# Patient Record
Sex: Female | Born: 1995 | Race: Black or African American | Hispanic: No | Smoking: Former smoker
Health system: Southern US, Community
[De-identification: ages and names within clinical notes are randomized; demographics above are authoritative.]

## PROBLEM LIST (undated history)

## (undated) ENCOUNTER — Inpatient Hospital Stay (HOSPITAL_COMMUNITY): Payer: Self-pay

## (undated) DIAGNOSIS — O24419 Gestational diabetes mellitus in pregnancy, unspecified control: Secondary | ICD-10-CM

## (undated) DIAGNOSIS — R002 Palpitations: Secondary | ICD-10-CM

## (undated) DIAGNOSIS — R079 Chest pain, unspecified: Secondary | ICD-10-CM

## (undated) HISTORY — DX: Palpitations: R00.2

## (undated) HISTORY — PX: TONSILLECTOMY: SUR1361

## (undated) HISTORY — PX: ADENOIDECTOMY: SUR15

## (undated) HISTORY — DX: Chest pain, unspecified: R07.9

---

## 2014-06-26 ENCOUNTER — Emergency Department (HOSPITAL_COMMUNITY): Payer: Medicaid Other

## 2014-06-26 ENCOUNTER — Emergency Department (HOSPITAL_COMMUNITY)
Admission: EM | Admit: 2014-06-26 | Discharge: 2014-06-26 | Disposition: A | Discharge status: Home / Self Care | Payer: Medicaid Other | Attending: Emergency Medicine | Admitting: Emergency Medicine

## 2014-06-26 ENCOUNTER — Encounter (HOSPITAL_COMMUNITY): Payer: Self-pay | Admitting: Emergency Medicine

## 2014-06-26 DIAGNOSIS — R1011 Right upper quadrant pain: Secondary | ICD-10-CM | POA: Insufficient documentation

## 2014-06-26 DIAGNOSIS — Z3202 Encounter for pregnancy test, result negative: Secondary | ICD-10-CM | POA: Diagnosis not present

## 2014-06-26 LAB — COMPREHENSIVE METABOLIC PANEL
ALBUMIN: 3.9 g/dL (ref 3.5–5.2)
ALK PHOS: 55 U/L (ref 39–117)
ALT: 14 U/L (ref 0–35)
ANION GAP: 11 (ref 5–15)
AST: 18 U/L (ref 0–37)
BUN: 12 mg/dL (ref 6–23)
CALCIUM: 9.1 mg/dL (ref 8.4–10.5)
CO2: 24 mEq/L (ref 19–32)
Chloride: 105 mEq/L (ref 96–112)
Creatinine, Ser: 0.71 mg/dL (ref 0.50–1.10)
GFR calc non Af Amer: >90 mL/min (ref >90)
GLUCOSE: 89 mg/dL (ref 70–99)
POTASSIUM: 4 meq/L (ref 3.7–5.3)
Sodium: 140 mEq/L (ref 137–147)
TOTAL PROTEIN: 6.9 g/dL (ref 6.0–8.3)
Total Bilirubin: <0.2 mg/dL — ABNORMAL LOW (ref 0.3–1.2)

## 2014-06-26 LAB — URINALYSIS, ROUTINE W REFLEX MICROSCOPIC
Bilirubin Urine: NEGATIVE
GLUCOSE, UA: NEGATIVE mg/dL
Hgb urine dipstick: NEGATIVE
Ketones, ur: NEGATIVE mg/dL
LEUKOCYTES UA: NEGATIVE
Nitrite: NEGATIVE
PH: 6.5 (ref 5.0–8.0)
Protein, ur: NEGATIVE mg/dL
SPECIFIC GRAVITY, URINE: 1.034 — AB (ref 1.005–1.030)
Urobilinogen, UA: 1 mg/dL (ref 0.0–1.0)

## 2014-06-26 LAB — CBC WITH DIFFERENTIAL/PLATELET
Basophils Absolute: 0 10*3/uL (ref 0.0–0.1)
Basophils Relative: 1 % (ref 0–1)
Eosinophils Absolute: 0.1 10*3/uL (ref 0.0–0.7)
Eosinophils Relative: 2 % (ref 0–5)
HEMATOCRIT: 32.3 % — AB (ref 36.0–46.0)
HEMOGLOBIN: 10.7 g/dL — AB (ref 12.0–15.0)
LYMPHS ABS: 3 10*3/uL (ref 0.7–4.0)
Lymphocytes Relative: 48 % — ABNORMAL HIGH (ref 12–46)
MCH: 29.9 pg (ref 26.0–34.0)
MCHC: 33.1 g/dL (ref 30.0–36.0)
MCV: 90.2 fL (ref 78.0–100.0)
MONO ABS: 0.5 10*3/uL (ref 0.1–1.0)
MONOS PCT: 8 % (ref 3–12)
NEUTROS ABS: 2.6 10*3/uL (ref 1.7–7.7)
Neutrophils Relative %: 41 % — ABNORMAL LOW (ref 43–77)
Platelets: 319 10*3/uL (ref 150–400)
RBC: 3.58 MIL/uL — AB (ref 3.87–5.11)
RDW: 12.9 % (ref 11.5–15.5)
WBC: 6.2 10*3/uL (ref 4.0–10.5)

## 2014-06-26 LAB — LIPASE, BLOOD: Lipase: 21 U/L (ref 11–59)

## 2014-06-26 LAB — POC URINE PREG, ED: PREG TEST UR: NEGATIVE

## 2014-06-26 MED ORDER — HYDROCODONE-ACETAMINOPHEN 5-325 MG PO TABS: 1.0000 | ORAL_TABLET | Freq: Four times a day (QID) | ORAL | Status: DC | PRN

## 2014-06-26 MED ORDER — PROMETHAZINE HCL 25 MG PO TABS: 25.0000 mg | ORAL_TABLET | Freq: Four times a day (QID) | ORAL | Status: DC | PRN

## 2014-06-26 MED ORDER — IBUPROFEN 600 MG PO TABS: 600.0000 mg | ORAL_TABLET | Freq: Four times a day (QID) | ORAL | Status: DC | PRN

## 2014-06-26 NOTE — ED Notes (Signed)
Patient here with complaint of lower abdominal and back pain. Endorses tenderness with palpation which radiates into pelvis. Denies fevers. Pain is intermittent.

## 2014-06-26 NOTE — ED Notes (Signed)
MD at bedside. 

## 2014-06-26 NOTE — ED Provider Notes (Signed)
CSN: [id number]     Arrival date & time 06/26/14  0136 History   This chart was scribed for Derwood KaplanAnkit Cassandra Harbold, MD by Freida Busmaniana Omoyeni, ED Scribe. This patient was seen in room D35C/D35C and the patient's care was started 3:39 AM.    Chief Complaint  Patient presents with  . Abdominal Pain  . Back Pain     The history is provided by the patient. No language interpreter was used.    HPI Comments:  Robin [name] is a 18 y.o. female who presents to the Emergency Department complaining of intermittent sharp abdominal pain that radiates into her back pain, with initial onset 2 months ago. She notes today's episode started around 0200 and has been constant since; she notes pain today is worse than pain felt in the past. She also reports the majority of her pain to her RUQ and lower abdomen; states RUQ pain is exacerbated by palpation. She reports associated nausea; none at this time. She denies a h/o pelvic disorders and heavy ETOH use. She also denies fever, dysuria, hematuria, vomiting, and diarrhea. No alleviating factors noted.   History reviewed. No pertinent past medical history. Past Surgical History  Procedure Laterality Date  . Tonsillectomy    . Adenoidectomy     History reviewed. No pertinent family history. History  Substance Use Topics  . Smoking status: Never Smoker   . Smokeless tobacco: Not on file  . Alcohol Use: No   OB History    No data available     Review of Systems  Constitutional: Negative for fever.  Gastrointestinal: Positive for nausea and abdominal distention. Negative for vomiting and diarrhea.  Genitourinary: Negative for dysuria and hematuria.  Musculoskeletal: Positive for back pain.  All other systems reviewed and are negative.     Allergies  Review of patient's allergies indicates no known allergies.  Home Medications   Prior to Admission medications   Medication Sig Start Date End Date Taking? Authorizing Provider  HYDROcodone-acetaminophen  (NORCO/VICODIN) 5-325 MG per tablet Take 1 tablet by mouth every 6 (six) hours as needed. 06/26/14   Derwood KaplanAnkit Ciara Kagan, MD  ibuprofen (ADVIL,MOTRIN) 600 MG tablet Take 1 tablet (600 mg total) by mouth every 6 (six) hours as needed. 06/26/14   Derwood KaplanAnkit Edilberto Roosevelt, MD  promethazine (PHENERGAN) 25 MG tablet Take 1 tablet (25 mg total) by mouth every 6 (six) hours as needed for nausea. 06/26/14   Tangie Stay, MD   BP 112/50 mmHg  Pulse 75  Temp(Src) 98.4 F (36.9 C) (Oral)  Resp 16  Ht 5\' 6"  (1.676 m)  Wt 290 lb (131.543 kg)  BMI 46.83 kg/m2  SpO2 100%  LMP  Physical Exam  Constitutional: She is oriented to person, place, and time. She appears well-developed and well-nourished.  HENT:  Head: Normocephalic and atraumatic.  Cardiovascular: Normal rate.   Pulmonary/Chest: Effort normal.  Abdominal: Soft. Bowel sounds are normal. She exhibits no distension. There is tenderness.  Diffuse TTP worse over RUQ and suprapubic abdomen.  Musculoskeletal: She exhibits no edema or tenderness.  Neurological: She is alert and oriented to person, place, and time.  Skin: Skin is warm and dry.  Psychiatric: She has a normal mood and affect.  Nursing note and vitals reviewed.   ED Course  Procedures   DIAGNOSTIC STUDIES:  Oxygen Saturation is 99% on RA, normal by my interpretation.    COORDINATION OF CARE:  3:44 AM Discussed treatment plan with pt at bedside and pt agreed to plan.  Labs Review  Labs Reviewed  COMPREHENSIVE METABOLIC PANEL - Abnormal; Notable for the following:    Total Bilirubin <0.2 (*)    All other components within normal limits  CBC WITH DIFFERENTIAL - Abnormal; Notable for the following:    RBC 3.58 (*)    Hemoglobin 10.7 (*)    HCT 32.3 (*)    Neutrophils Relative % 41 (*)    Lymphocytes Relative 48 (*)    All other components within normal limits  URINALYSIS, ROUTINE W REFLEX MICROSCOPIC - Abnormal; Notable for the following:    APPearance HAZY (*)    Specific Gravity,  Urine 1.034 (*)    All other components within normal limits  LIPASE, BLOOD  POC URINE PREG, ED  POC URINE PREG, ED    Imaging Review No results found.   EKG Interpretation None     Final result by Rad Results In Interface (06/26/14 07:24:06)   Narrative:   CLINICAL DATA: Right upper quadrant pain for 2 months.  EXAM: US ABDOMEN LIMITED - RIGHT UPPER QUADRANT  COMPARISON: None.  FINDINGS: Gallbladder:  No gallstones or wall thickening visualized. No sonographic Murphy sign noted.  Common bile duct:  Diameter: Normal caliber, 4 mm  Liver:  No focal lesion identified. Within normal limits in parenchymal echogenicity.  IMPRESSION: Unremarkable right upper quadrant ultrasound.    MDM   Final diagnoses:  RUQ abdominal pain    DDx includes: Pancreatitis Hepatobiliary pathology including cholecystitis Gastritis/PUD SBO  Pt comes in with cc of abd pain. Pain is RUQ. US and labs are unremarkable. Will give her GI info - as she mild have biliary dyskinesia or other functional abnormality of the hepatobiliary system.      Derwood KaplanAnkit Latysha Thackston, MD 06/28/14 [phone]

## 2014-06-26 NOTE — Discharge Instructions (Signed)
We saw you in the ER for the abdominal pain. All of our results are normal, including all labs and imaging. Kidney function is fine as well. We are not sure what is causing your abdominal pain, but it still could be due to gallbladder problems.  We recommend that you see your primary care doctor within 2-3 days for further evaluation, and contact GI doctor as well. If your symptoms get worse, return to the ER. Take the pain meds and nausea meds as prescribed.   Abdominal Pain, Women Abdominal (stomach, pelvic, or belly) pain can be caused by many things. It is important to tell your doctor:  The location of the pain.  Does it come and go or is it present all the time?  Are there things that start the pain (eating certain foods, exercise)?  Are there other symptoms associated with the pain (fever, nausea, vomiting, diarrhea)? All of this is helpful to know when trying to find the cause of the pain. CAUSES   Stomach: virus or bacteria infection, or ulcer.  Intestine: appendicitis (inflamed appendix), regional ileitis (Crohn's disease), ulcerative colitis (inflamed colon), irritable bowel syndrome, diverticulitis (inflamed diverticulum of the colon), or cancer of the stomach or intestine.  Gallbladder disease or stones in the gallbladder.  Kidney disease, kidney stones, or infection.  Pancreas infection or cancer.  Fibromyalgia (pain disorder).  Diseases of the female organs:  Uterus: fibroid (non-cancerous) tumors or infection.  Fallopian tubes: infection or tubal pregnancy.  Ovary: cysts or tumors.  Pelvic adhesions (scar tissue).  Endometriosis (uterus lining tissue growing in the pelvis and on the pelvic organs).  Pelvic congestion syndrome (female organs filling up with blood just before the menstrual period).  Pain with the menstrual period.  Pain with ovulation (producing an egg).  Pain with an IUD (intrauterine device, birth control) in the uterus.  Cancer of  the female organs.  Functional pain (pain not caused by a disease, may improve without treatment).  Psychological pain.  Depression. DIAGNOSIS  Your doctor will decide the seriousness of your pain by doing an examination.  Blood tests.  X-rays.  Ultrasound.  CT scan (computed tomography, special type of X-ray).  MRI (magnetic resonance imaging).  Cultures, for infection.  Barium enema (dye inserted in the large intestine, to better view it with X-rays).  Colonoscopy (looking in intestine with a lighted tube).  Laparoscopy (minor surgery, looking in abdomen with a lighted tube).  Major abdominal exploratory surgery (looking in abdomen with a large incision). TREATMENT  The treatment will depend on the cause of the pain.   Many cases can be observed and treated at home.  Over-the-counter medicines recommended by your caregiver.  Prescription medicine.  Antibiotics, for infection.  Birth control pills, for painful periods or for ovulation pain.  Hormone treatment, for endometriosis.  Nerve blocking injections.  Physical therapy.  Antidepressants.  Counseling with a psychologist or psychiatrist.  Minor or major surgery. HOME CARE INSTRUCTIONS   Do not take laxatives, unless directed by your caregiver.  Take over-the-counter pain medicine only if ordered by your caregiver. Do not take aspirin because it can cause an upset stomach or bleeding.  Try a clear liquid diet (broth or water) as ordered by your caregiver. Slowly move to a bland diet, as tolerated, if the pain is related to the stomach or intestine.  Have a thermometer and take your temperature several times a day, and record it.  Bed rest and sleep, if it helps the pain.  Avoid  sexual intercourse, if it causes pain.  Avoid stressful situations.  Keep your follow-up appointments and tests, as your caregiver orders.  If the pain does not go away with medicine or surgery, you may  try:  Acupuncture.  Relaxation exercises (yoga, meditation).  Group therapy.  Counseling. SEEK MEDICAL CARE IF:   You notice certain foods cause stomach pain.  Your home care treatment is not helping your pain.  You need stronger pain medicine.  You want your IUD removed.  You feel faint or lightheaded.  You develop nausea and vomiting.  You develop a rash.  You are having side effects or an allergy to your medicine. SEEK IMMEDIATE MEDICAL CARE IF:   Your pain does not go away or gets worse.  You have a fever.  Your pain is felt only in portions of the abdomen. The right side could possibly be appendicitis. The left lower portion of the abdomen could be colitis or diverticulitis.  You are passing blood in your stools (bright red or black tarry stools, with or without vomiting).  You have blood in your urine.  You develop chills, with or without a fever.  You pass out. MAKE SURE YOU:   Understand these instructions.  Will watch your condition.  Will get help right away if you are not doing well or get worse. Document Released: 04/23/2007 Document Revised: 11/10/2013 Document Reviewed: 05/13/2009 [facility] Patient Information 2015 [place] [place] [place]. This information is not intended to replace advice given to you by your health care provider. Make sure you discuss any questions you have with your health care provider.  Biliary Colic  Biliary colic is a steady or irregular pain in the upper abdomen. It is usually under the right side of the rib cage. It happens when gallstones interfere with the normal flow of bile from the gallbladder. Bile is a liquid that helps to digest fats. Bile is made in the liver and stored in the gallbladder. When you eat a meal, bile passes from the gallbladder through the cystic duct and the common bile duct into the small intestine. There, it mixes with partially digested food. If a gallstone blocks either of these ducts, the normal flow of  bile is blocked. The muscle cells in the bile duct contract forcefully to try to move the stone. This causes the pain of biliary colic.  SYMPTOMS   A person with biliary colic usually complains of pain in the upper abdomen. This pain can be:  In the center of the upper abdomen just below the breastbone.  In the upper-right part of the abdomen, near the gallbladder and liver.  Spread back toward the right shoulder blade.  Nausea and vomiting.  The pain usually occurs after eating.  Biliary colic is usually triggered by the digestive system's demand for bile. The demand for bile is high after fatty meals. Symptoms can also occur when a person who has been fasting suddenly eats a very large meal. Most episodes of biliary colic pass after 1 to 5 hours. After the most intense pain passes, your abdomen may continue to ache mildly for about 24 hours. DIAGNOSIS  After you describe your symptoms, your caregiver will perform a physical exam. He or she will pay attention to the upper right portion of your belly (abdomen). This is the area of your liver and gallbladder. An ultrasound will help your caregiver look for gallstones. Specialized scans of the gallbladder may also be done. Blood tests may be done, especially if you have  fever or if your pain persists. PREVENTION  Biliary colic can be prevented by controlling the risk factors for gallstones. Some of these risk factors, such as heredity, increasing age, and pregnancy are a normal part of life. Obesity and a high-fat diet are risk factors you can change through a healthy lifestyle. Women going through menopause who take hormone replacement therapy (estrogen) are also more likely to develop biliary colic. TREATMENT   Pain medication may be prescribed.  You may be encouraged to eat a fat-free diet.  If the first episode of biliary colic is severe, or episodes of colic keep retuning, surgery to remove the gallbladder (cholecystectomy) is usually  recommended. This procedure can be done through small incisions using an instrument called a laparoscope. The procedure often requires a brief stay in the hospital. Some people can leave the hospital the same day. It is the most widely used treatment in people troubled by painful gallstones. It is effective and safe, with no complications in more than 90% of cases.  If surgery cannot be done, medication that dissolves gallstones may be used. This medication is expensive and can take months or years to work. Only small stones will dissolve.  Rarely, medication to dissolve gallstones is combined with a procedure called shock-wave lithotripsy. This procedure uses carefully aimed shock waves to break up gallstones. In many people treated with this procedure, gallstones form again within a few years. PROGNOSIS  If gallstones block your cystic duct or common bile duct, you are at risk for repeated episodes of biliary colic. There is also a 25% chance that you will develop a gallbladder infection(acute cholecystitis), or some other complication of gallstones within 10 to 20 years. If you have surgery, schedule it at a time that is convenient for you and at a time when you are not sick. HOME CARE INSTRUCTIONS   Drink plenty of clear fluids.  Avoid fatty, greasy or fried foods, or any foods that make your pain worse.  Take medications as directed. SEEK MEDICAL CARE IF:   You develop a fever over 100.5 F (38.1 C).  Your pain gets worse over time.  You develop nausea that prevents you from eating and drinking.  You develop vomiting. SEEK IMMEDIATE MEDICAL CARE IF:   You have continuous or severe belly (abdominal) pain which is not relieved with medications.  You develop nausea and vomiting which is not relieved with medications.  You have symptoms of biliary colic and you suddenly develop a fever and shaking chills. This may signal cholecystitis. Call your caregiver immediately.  You develop a  yellow color to your skin or the white part of your eyes (jaundice). Document Released: 11/27/2005 Document Revised: 09/18/2011 Document Reviewed: 02/06/2008 [facility] Patient Information 2015 [place], [place]. This information is not intended to replace advice given to you by your health care provider. Make sure you discuss any questions you have with your health care provider.

## 2017-05-22 ENCOUNTER — Other Ambulatory Visit (HOSPITAL_COMMUNITY): Payer: Self-pay | Admitting: Obstetrics and Gynecology

## 2017-05-22 DIAGNOSIS — Z3A20 20 weeks gestation of pregnancy: Secondary | ICD-10-CM

## 2017-05-22 DIAGNOSIS — Z3689 Encounter for other specified antenatal screening: Secondary | ICD-10-CM

## 2017-05-22 DIAGNOSIS — O280 Abnormal hematological finding on antenatal screening of mother: Secondary | ICD-10-CM

## 2017-05-28 ENCOUNTER — Encounter (HOSPITAL_COMMUNITY): Payer: Self-pay

## 2017-05-30 ENCOUNTER — Inpatient Hospital Stay (HOSPITAL_COMMUNITY)
Admission: RE | Admit: 2017-05-30 | Discharge: 2017-05-30 | Disposition: A | Discharge status: Home / Self Care | Payer: Self-pay | Source: Ambulatory Visit

## 2017-05-30 ENCOUNTER — Ambulatory Visit (HOSPITAL_COMMUNITY)
Admission: RE | Admit: 2017-05-30 | Discharge: 2017-05-30 | Disposition: A | Discharge status: Home / Self Care | Payer: Medicaid Other | Source: Ambulatory Visit

## 2017-06-13 ENCOUNTER — Ambulatory Visit (HOSPITAL_COMMUNITY): Admission: RE | Admit: 2017-06-13 | Payer: Medicaid Other | Source: Ambulatory Visit

## 2017-06-13 ENCOUNTER — Ambulatory Visit (HOSPITAL_COMMUNITY): Payer: Medicaid Other

## 2017-06-15 ENCOUNTER — Ambulatory Visit (HOSPITAL_COMMUNITY): Admission: RE | Admit: 2017-06-15 | Payer: Medicaid Other | Source: Ambulatory Visit

## 2017-06-15 ENCOUNTER — Other Ambulatory Visit (HOSPITAL_COMMUNITY): Payer: Self-pay | Admitting: Obstetrics and Gynecology

## 2017-06-15 ENCOUNTER — Encounter (HOSPITAL_COMMUNITY): Payer: Self-pay

## 2017-06-15 ENCOUNTER — Ambulatory Visit (HOSPITAL_COMMUNITY)
Admission: RE | Admit: 2017-06-15 | Discharge: 2017-06-15 | Disposition: A | Discharge status: Home / Self Care | Payer: Medicaid Other | Source: Ambulatory Visit | Attending: Obstetrics and Gynecology | Admitting: Obstetrics and Gynecology

## 2017-06-15 ENCOUNTER — Other Ambulatory Visit (HOSPITAL_COMMUNITY): Payer: Self-pay | Admitting: *Deleted

## 2017-06-15 DIAGNOSIS — Z3689 Encounter for other specified antenatal screening: Secondary | ICD-10-CM

## 2017-06-15 DIAGNOSIS — O4402 Placenta previa specified as without hemorrhage, second trimester: Secondary | ICD-10-CM | POA: Insufficient documentation

## 2017-06-15 DIAGNOSIS — Z3A22 22 weeks gestation of pregnancy: Secondary | ICD-10-CM | POA: Diagnosis not present

## 2017-06-15 DIAGNOSIS — O283 Abnormal ultrasonic finding on antenatal screening of mother: Secondary | ICD-10-CM | POA: Diagnosis not present

## 2017-06-15 DIAGNOSIS — O99212 Obesity complicating pregnancy, second trimester: Secondary | ICD-10-CM | POA: Insufficient documentation

## 2017-06-15 DIAGNOSIS — O280 Abnormal hematological finding on antenatal screening of mother: Secondary | ICD-10-CM

## 2017-06-15 DIAGNOSIS — O4400 Placenta previa specified as without hemorrhage, unspecified trimester: Secondary | ICD-10-CM

## 2017-06-15 DIAGNOSIS — Z3A20 20 weeks gestation of pregnancy: Secondary | ICD-10-CM

## 2017-06-15 NOTE — Progress Notes (Signed)
Passed a quarter sized blood clot this morning, no bleeding at this time.

## 2017-06-16 ENCOUNTER — Inpatient Hospital Stay (HOSPITAL_COMMUNITY)
Admission: AD | Admit: 2017-06-16 | Discharge: 2017-06-16 | Disposition: A | Discharge status: Home / Self Care | Payer: Medicaid Other | Source: Ambulatory Visit | Attending: Obstetrics and Gynecology | Admitting: Obstetrics and Gynecology

## 2017-06-16 ENCOUNTER — Encounter (HOSPITAL_COMMUNITY): Payer: Self-pay

## 2017-06-16 DIAGNOSIS — O4592 Premature separation of placenta, unspecified, second trimester: Secondary | ICD-10-CM | POA: Insufficient documentation

## 2017-06-16 DIAGNOSIS — Z3A22 22 weeks gestation of pregnancy: Secondary | ICD-10-CM | POA: Diagnosis not present

## 2017-06-16 DIAGNOSIS — O4590 Premature separation of placenta, unspecified, unspecified trimester: Secondary | ICD-10-CM

## 2017-06-16 DIAGNOSIS — O4692 Antepartum hemorrhage, unspecified, second trimester: Secondary | ICD-10-CM | POA: Diagnosis not present

## 2017-06-16 DIAGNOSIS — Z87891 Personal history of nicotine dependence: Secondary | ICD-10-CM | POA: Insufficient documentation

## 2017-06-16 LAB — RAPID URINE DRUG SCREEN, HOSP PERFORMED
Amphetamines: NOT DETECTED
Barbiturates: NOT DETECTED
Benzodiazepines: NOT DETECTED
Cocaine: NOT DETECTED
OPIATES: NOT DETECTED
TETRAHYDROCANNABINOL: POSITIVE — AB

## 2017-06-16 LAB — URINALYSIS, ROUTINE W REFLEX MICROSCOPIC
BILIRUBIN URINE: NEGATIVE
Glucose, UA: NEGATIVE mg/dL
Ketones, ur: NEGATIVE mg/dL
Leukocytes, UA: NEGATIVE
NITRITE: NEGATIVE
Protein, ur: 30 mg/dL — AB
SPECIFIC GRAVITY, URINE: 1.02 (ref 1.005–1.030)
pH: 6 (ref 5.0–8.0)

## 2017-06-16 NOTE — Discharge Instructions (Signed)
Pelvic Rest Pelvic rest may be recommended if:  Your placenta is partially or completely covering the opening of your cervix (placenta previa).  There is bleeding between the wall of the uterus and the amniotic sac in the first trimester of pregnancy (subchorionic hemorrhage).  You went into labor too early (preterm labor).  Based on your overall health and the health of your baby, your health care provider will decide if pelvic rest is right for you. How do I rest my pelvis? For as long as told by your health care provider:  Do not have sex, sexual stimulation, or an orgasm.  Do not use tampons. Do not douche. Do not put anything in your vagina.  Do not lift anything that is heavier than 10 lb (4.5 kg).  Avoid activities that take a lot of effort (are strenuous).  Avoid any activity in which your pelvic muscles could become strained.  When should I seek medical care? Seek medical care if you have:  Cramping pain in your lower abdomen.  Vaginal discharge.  A low, dull backache.  Regular contractions.  Uterine tightening.  When should I seek immediate medical care? Seek immediate medical care if:  You have vaginal bleeding and you are pregnant.  This information is not intended to replace advice given to you by your health care provider. Make sure you discuss any questions you have with your health care provider. Document Released: 10/21/2010 Document Revised: 12/02/2015 Document Reviewed: 12/28/2014 Elsevier Interactive Patient Education  2018 [employer]   Vaginal Bleeding During Pregnancy, Second Trimester A small amount of bleeding (spotting) from the vagina is common in pregnancy. Sometimes the bleeding is normal and is not a problem, and sometimes it is a sign of something serious. Be sure to tell your doctor about any bleeding from your vagina right away. Follow these instructions at home:  Watch your condition for any changes.  Follow your doctor's  instructions about how active you can be.  If you are on bed rest: ? You may need to stay in bed and only get up to use the bathroom. ? You may be allowed to do some activities. ? If you need help, make plans for someone to help you.  Write down: ? The number of pads you use each day. ? How often you change pads. ? How soaked (saturated) your pads are.  Do not use tampons.  Do not douche.  Do not have sex or orgasms until your doctor says it is okay.  If you pass any tissue from your vagina, save the tissue so you can show it to your doctor.  Only take medicines as told by your doctor.  Do not take aspirin because it can make you bleed.  Do not exercise, lift heavy weights, or do any activities that take a lot of energy and effort unless your doctor says it is okay.  Keep all follow-up visits as told by your doctor. Contact a doctor if:  You bleed from your vagina.  You have cramps.  You have labor pains.  You have a fever that does not go away after you take medicine. Get help right away if:  You have very bad cramps in your back or belly (abdomen).  You have contractions.  You have chills.  You pass large clots or tissue from your vagina.  You bleed more.  You feel light-headed or weak.  You pass out (faint).  You are leaking fluid or have a gush of fluid from  your vagina. This information is not intended to replace advice given to you by your health care provider. Make sure you discuss any questions you have with your health care provider. Document Released: 11/10/2013 Document Revised: 12/02/2015 Document Reviewed: 03/03/2013 Elsevier Interactive Patient Education  2018 [employer]   Placental Abruption Placental abruption is a condition in which the placenta partly or completely separates from the uterus before the baby is born. The placenta is the organ that nourishes the unborn baby (fetus). The baby gets his or her blood supply and nutrients  through the placenta. It is the babys life support system. The placenta is attached to the inside of the uterus until after the baby is born. Placental abruption is rare, but it can happen any time after 20 weeks of pregnancy. A small separation may not cause problems, but a large separation may be dangerous for you and your baby. A large separation is usually an emergency. It requires treatment right away. What are the causes? In most cases, the cause of this condition is not known. What increases the risk? This condition is more likely to develop in women who:  Have experienced a recent trauma such as a fall, an abdominal injury, or a car accident.  Have a previous placental abruption.  Have high blood pressure (hypertension).  Smoke cigarettes, use alcohol, or use illegal drugs such as cocaine.  Have blood clotting problems.  Experience preterm premature rupture of membranes (PPROM).  Have multiples (twins, triplets, or more).  Have had children before.  Are 22 years of age or older.  What are the signs or symptoms? Symptoms of this condition can vary from mild to severe. A small placental abruption may not cause symptoms, or it may cause mild symptoms, which may include:  Mild abdominal pain or lower back pain.  Slight vaginal bleeding.  A severe placental abruption will cause symptoms. The symptoms will depend on the size of the separation and the stage of pregnancy. They may include:  Abdominal pain or lower back pain.  Vaginal bleeding.  Tender and hard uterus.  Severe abdominal pain with tenderness.  Continual contractions of your uterus.  Weakness and light-headedness.  How is this diagnosed? This condition may be diagnosed based on:  Your symptoms.  A physical exam.  Ultrasound.  Blood work. This will be done to make sure that there are enough healthy red blood cells and that there are no clotting problems or signs of too much blood loss.  How is  this treated? Treatment for placental abruption depends on the severity of the condition. For mild cases, treatment may involve monitoring your condition and managing your symptoms. This may involve:  Bed rest and close observation.  For more severe cases, emergency treatment is needed. This may involve:  Staying in the hospital until you and your baby are stabilized.  Cesarean delivery of your baby.  A blood transfusion or other fluids given through an IV tube.  Other treatments, depending on: ? The amount of bleeding you have. ? Whether you or your baby are in distress. ? The stage of your pregnancy. ? The maturity of the baby.  Follow these instructions at home:  Take over-the-counter and prescription medicines only as told by your health care provider. Do not take any medicines that your health care provider has not approved.  Arrange for help at home before and after you deliver your baby, especially if you had a cesarean delivery or if you lost a lot of  blood.  Get plenty of rest and sleep.  Do not use illegal drugs.  Do not drink alcohol.  Do not have sexual intercourse until your health care provider says it is okay.  Do not use tampons or douche unless your health care provider says it is okay.  Do not use any products that contain nicotine or tobacco, such as cigarettes and e-cigarettes. If you need help quitting, ask your health care provider. Get help right away if:  You have vaginal bleeding or spotting.  You have any type of trauma, such as a fall, abdominal trauma, or a car accident.  You have abdominal pain.  You have continuous uterine contractions.  You have a hard, tender uterus.  You do not feel the baby move, or the baby moves very little. This information is not intended to replace advice given to you by your health care provider. Make sure you discuss any questions you have with your health care provider. Document Released: 06/26/2005 Document  Revised: 02/24/2016 Document Reviewed: 01/16/2016 Elsevier Interactive Patient Education  2018 [employer].   Placental Abruption Placental abruption is a condition in which the placenta partly or completely separates from the uterus before the baby is born. The placenta is the organ that nourishes the unborn baby (fetus). The baby gets his or her blood supply and nutrients through the placenta. It is the babys life support system. The placenta is attached to the inside of the uterus until after the baby is born. Placental abruption is rare, but it can happen any time after 20 weeks of pregnancy. A small separation may not cause problems, but a large separation may be dangerous for you and your baby. A large separation is usually an emergency. It requires treatment right away. What are the causes? In most cases, the cause of this condition is not known. What increases the risk? This condition is more likely to develop in women who:  Have experienced a recent trauma such as a fall, an abdominal injury, or a car accident.  Have a previous placental abruption.  Have high blood pressure (hypertension).  Smoke cigarettes, use alcohol, or use illegal drugs such as cocaine.  Have blood clotting problems.  Experience preterm premature rupture of membranes (PPROM).  Have multiples (twins, triplets, or more).  Have had children before.  Are 20 years of age or older.  What are the signs or symptoms? Symptoms of this condition can vary from mild to severe. A small placental abruption may not cause symptoms, or it may cause mild symptoms, which may include:  Mild abdominal pain or lower back pain.  Slight vaginal bleeding.  A severe placental abruption will cause symptoms. The symptoms will depend on the size of the separation and the stage of pregnancy. They may include:  Abdominal pain or lower back pain.  Vaginal bleeding.  Tender and hard uterus.  Severe abdominal pain with  tenderness.  Continual contractions of your uterus.  Weakness and light-headedness.  How is this diagnosed? This condition may be diagnosed based on:  Your symptoms.  A physical exam.  Ultrasound.  Blood work. This will be done to make sure that there are enough healthy red blood cells and that there are no clotting problems or signs of too much blood loss.  How is this treated? Treatment for placental abruption depends on the severity of the condition. For mild cases, treatment may involve monitoring your condition and managing your symptoms. This may involve:  Bed rest and close observation.  For more severe cases, emergency treatment is needed. This may involve:  Staying in the hospital until you and your baby are stabilized.  Cesarean delivery of your baby.  A blood transfusion or other fluids given through an IV tube.  Other treatments, depending on: ? The amount of bleeding you have. ? Whether you or your baby are in distress. ? The stage of your pregnancy. ? The maturity of the baby.  Follow these instructions at home:  Take over-the-counter and prescription medicines only as told by your health care provider. Do not take any medicines that your health care provider has not approved.  Arrange for help at home before and after you deliver your baby, especially if you had a cesarean delivery or if you lost a lot of blood.  Get plenty of rest and sleep.  Do not use illegal drugs.  Do not drink alcohol.  Do not have sexual intercourse until your health care provider says it is okay.  Do not use tampons or douche unless your health care provider says it is okay.  Do not use any products that contain nicotine or tobacco, such as cigarettes and e-cigarettes. If you need help quitting, ask your health care provider. Get help right away if:  You have vaginal bleeding or spotting.  You have any type of trauma, such as a fall, abdominal trauma, or a car  accident.  You have abdominal pain.  You have continuous uterine contractions.  You have a hard, tender uterus.  You do not feel the baby move, or the baby moves very little. This information is not intended to replace advice given to you by your health care provider. Make sure you discuss any questions you have with your health care provider. Document Released: 06/26/2005 Document Revised: 02/24/2016 Document Reviewed: 01/16/2016 Elsevier Interactive Patient Education  [employer].

## 2017-06-16 NOTE — MAU Note (Signed)
Woke up this AM and saw blood in the toilet and when she wiped. Saw blood clots in the toilet and when she wiped, No LOF.

## 2017-06-16 NOTE — MAU Provider Note (Signed)
History   G2P1001 @ 22.6 wks in with vag bleeding with sm clots. Denies ROM. Pt being seen at [facility] for care. States she has been bleeding her entire pregnancy off and on.   CSN: [id number]  Arrival date & time 06/16/17  1247   None     Chief Complaint  Patient presents with  . Vaginal Bleeding    HPI  History reviewed. No pertinent past medical history.  Past Surgical History:  Procedure Laterality Date  . ADENOIDECTOMY    . TONSILLECTOMY      History reviewed. No pertinent family history.  Social History   Tobacco Use  . Smoking status: Former Games developermoker  . Smokeless tobacco: Never Used  Substance Use Topics  . Alcohol use: No  . Drug use: No    OB History    Gravida Para Term Preterm AB Living   2 1 1     1    SAB TAB Ectopic Multiple Live Births                  Review of Systems  Constitutional: Negative.   HENT: Negative.   Eyes: Negative.   Respiratory: Negative.   Cardiovascular: Negative.   Gastrointestinal: Negative.   Endocrine: Negative.   Genitourinary: Positive for vaginal bleeding.  Musculoskeletal: Negative.   Skin: Negative.   Allergic/Immunologic: Negative.   Neurological: Negative.   Hematological: Negative.   Psychiatric/Behavioral: Negative.     Allergies  Patient has no known allergies.  Home Medications    BP (!) 115/56 (BP Location: Left Arm)   Pulse 91   Temp 98.2 F (36.8 C) (Oral)   Resp 18   Physical Exam  Constitutional: She is oriented to person, place, and time. She appears well-developed and well-nourished.  HENT:  Head: Normocephalic.  Eyes: Pupils are equal, round, and reactive to light.  Neck: Normal range of motion.  Cardiovascular: Normal rate, regular rhythm, normal heart sounds and intact distal pulses.  Pulmonary/Chest: Effort normal and breath sounds normal.  Abdominal: Soft. Bowel sounds are normal.  Genitourinary: Uterus normal.  Genitourinary Comments: sm amt vaginal bleeding.   Musculoskeletal: Normal range of motion.  Neurological: She is alert and oriented to person, place, and time. She has normal reflexes.  Skin: Skin is warm and dry.  Psychiatric: She has a normal mood and affect. Her behavior is normal. Judgment and thought content normal.    MAU Course  Procedures (including critical care time)  Labs Reviewed  RAPID URINE DRUG SCREEN, HOSP PERFORMED  URINALYSIS, ROUTINE W REFLEX MICROSCOPIC   [place] Mfm Ob Detail +14 Wk  Result Date: 06/15/2017 ----------------------------------------------------------------------  OBSTETRICS REPORT                      (Signed Final 06/15/2017 11:45 am) ---------------------------------------------------------------------- Patient Info  ID #:       [id number]                          D.O.B.:  12/27/1995 (20 yrs)  Name:       Carolanne GrumblingKAYLA Carre                    Visit Date: 06/15/2017 10:07 am ---------------------------------------------------------------------- Performed By  Performed By:     Benjamine Molaevin Vics             Referred By:      Aundra MilletMEGAN RENEE  RDMS,RVT                                 NGUYEN  Attending:        Charlsie Merles MD         Location:         [facility] ---------------------------------------------------------------------- Orders   #  Description                                 Code   1  [place] MFM OB DETAIL +14 WK                     76811.01  ----------------------------------------------------------------------   #  Ordered By               Order #        Accession #    Episode #   1  [place] Dec             [id number]      [id number]     [id number]  ---------------------------------------------------------------------- Indications   [redacted] weeks gestation of pregnancy                Z3A.22   Placenta previa specified as without           O44.02   hemorrhage, second trimester   Obesity complicating pregnancy, second         O99.212   trimester (Pre Pregnancy BMI 45.2)   Encounter for fetal anatomic survey             Z36.89   Abnormal biochemical screen (quad) for         O28.9   Trisomy 21 (1:20)   Vaginal bleeding in pregnancy, second          O46.92   trimester  ---------------------------------------------------------------------- OB History  Blood Type:            Height:  5'6"   Weight (lb):  289       BMI:  46.64  Gravidity:    2         Term:   1  Living:       1 ---------------------------------------------------------------------- Fetal Evaluation  Num Of Fetuses:     1  Fetal Heart         140  Rate(bpm):  Cardiac Activity:   Observed  Presentation:       Breech  Placenta:           Anterior; ? Marginal Abruption  P. Cord Insertion:  Visualized  Amniotic Fluid  AFI FV:      Subjectively within normal limits                              Largest Pocket(cm)                              4.97 ---------------------------------------------------------------------- Biometry  BPD:      52.7  mm     G. Age:  22w 0d         20  %    CI:        70.98   %    70 - 86  FL/HC:      20.6   %    19.2 - 20.8  HC:      199.3  mm     G. Age:  22w 1d         16  %    HC/AC:      1.07        1.05 - 1.21  AC:      186.1  mm     G. Age:  23w 3d         64  %    FL/BPD:     78.0   %    71 - 87  FL:       41.1  mm     G. Age:  23w 2d         60  %    FL/AC:      22.1   %    20 - 24  HUM:      41.2  mm     G. Age:  25w 0d       > 95  %  CER:      24.7  mm     G. Age:  22w 5d         50  %  NFT:       5.5  mm  CM:        6.7  mm  Est. FW:     570  gm      1 lb 4 oz     59  % ---------------------------------------------------------------------- Gestational Age  U/S Today:     22w 5d                                        EDD:   10/14/17  Best:          22w 5d     Det. By:  Marcella DubsEarly Ultrasound         EDD:   10/14/17                                      (02/26/17) ---------------------------------------------------------------------- Anatomy  Cranium:               Appears normal         Aortic  Arch:            Appears normal  Cavum:                 Appears normal         Ductal Arch:            Appears normal  Ventricles:            Appears normal         Diaphragm:              Appears normal  Choroid Plexus:        Appears normal         Stomach:                Appears normal, left  sided  Cerebellum:            Appears normal         Abdomen:                Appears normal  Posterior Fossa:       Appears normal         Abdominal Wall:         Appears nml (cord                                                                        insert, abd wall)  Nuchal Fold:           Appears normal         Cord Vessels:           Appears normal (3                                                                        vessel cord)  Face:                  Appears normal         Kidneys:                Appear normal                         (orbits and profile)  Lips:                  Appears normal         Bladder:                Appears normal  Thoracic:              Appears normal         Spine:                  Not well visualized  Heart:                 Appears normal         Upper Extremities:      Appears normal;                         (4CH, axis, and situs                          hands nwv  RVOT:                  Not well visualized    Lower Extremities:      Appears normal  LVOT:                  Appears normal  Other:  Fetus appears to be a female. Heels visualized. Technically difficult due          to maternal habitus and  fetal position. ---------------------------------------------------------------------- Cervix Uterus Adnexa  Cervix  Length:           3.51  cm.  Normal appearance by transabdominal scan.  Uterus  No abnormality visualized.  Left Ovary  Size(cm)       4.1  x   2.6    x  3.9       Vol(ml): 21.8  Within normal limits.  Right Ovary  Size(cm)       4.9  x   3.2    x  4.2       Vol(ml): 34.5  Within normal limits.  ---------------------------------------------------------------------- Impression  Singleton intrauterine pregnancy at 22+5 weeks with  abnormal quad screen  Placentation shows a marginal blood collection at the inferior  edge but no previa  Review of the anatomy shows no sonographic markers for  aneuploidy or structural anomalies  However, cardiac and spinal evaluations should be  considered suboptimal secondary to maternal body habitus  and fetal position  Amniotic fluid volume is normal  Estimated fetal weight is 570g which is growth in the 59th  percentile ---------------------------------------------------------------------- Recommendations  The patient had a low-risk cell free DNA test (Informaseq) in  follow up to her abnormal quad screen. She is comfortable  with the information that provides and does not feel the need  to see the genetic counselor today. She should have a  growth scan around 30 weeks due to the association  between high inhibin-A and growth restriction  Repeat scan in 4 weeks to assess placenta and complete  anatomic survey ----------------------------------------------------------------------                 Charlsie Merles, MD Electronically Signed Final Report   06/15/2017 11:45 am ----------------------------------------------------------------------    1. Vaginal bleeding in pregnancy, second trimester   2. Antepartum placental abruption       MDM  VSS, fhr st and reg with doppler. abd soft and non tender. Sterile spec exam sm amt pinkish bleeding. Cervical os closed and thick. Reviewed [place] per MFM yesterday and marginal abruption is noted. Lengthy discussion with pt regarding diagnosis and POC. 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 Precautions giver.

## 2017-06-19 ENCOUNTER — Other Ambulatory Visit: Payer: Self-pay

## 2017-07-13 ENCOUNTER — Encounter (HOSPITAL_COMMUNITY): Payer: Self-pay

## 2017-07-13 ENCOUNTER — Ambulatory Visit (HOSPITAL_COMMUNITY)
Admission: RE | Admit: 2017-07-13 | Discharge: 2017-07-13 | Disposition: A | Discharge status: Home / Self Care | Payer: Medicaid Other | Source: Ambulatory Visit | Attending: Obstetrics and Gynecology | Admitting: Obstetrics and Gynecology

## 2017-07-13 DIAGNOSIS — O4402 Placenta previa specified as without hemorrhage, second trimester: Secondary | ICD-10-CM | POA: Diagnosis not present

## 2017-07-13 DIAGNOSIS — O289 Unspecified abnormal findings on antenatal screening of mother: Secondary | ICD-10-CM | POA: Insufficient documentation

## 2017-07-13 DIAGNOSIS — O99212 Obesity complicating pregnancy, second trimester: Secondary | ICD-10-CM | POA: Insufficient documentation

## 2017-07-13 DIAGNOSIS — Z3A26 26 weeks gestation of pregnancy: Secondary | ICD-10-CM | POA: Diagnosis not present

## 2017-07-13 DIAGNOSIS — O4400 Placenta previa specified as without hemorrhage, unspecified trimester: Secondary | ICD-10-CM

## 2017-07-16 ENCOUNTER — Other Ambulatory Visit (HOSPITAL_COMMUNITY): Payer: Self-pay | Admitting: *Deleted

## 2017-07-16 DIAGNOSIS — R7989 Other specified abnormal findings of blood chemistry: Secondary | ICD-10-CM

## 2017-08-10 ENCOUNTER — Encounter (HOSPITAL_COMMUNITY): Payer: Self-pay

## 2017-08-10 ENCOUNTER — Other Ambulatory Visit (HOSPITAL_COMMUNITY): Payer: Self-pay | Admitting: *Deleted

## 2017-08-10 ENCOUNTER — Ambulatory Visit (HOSPITAL_COMMUNITY)
Admission: RE | Admit: 2017-08-10 | Discharge: 2017-08-10 | Disposition: A | Discharge status: Home / Self Care | Payer: Medicaid Other | Source: Ambulatory Visit | Attending: Obstetrics and Gynecology | Admitting: Obstetrics and Gynecology

## 2017-08-10 DIAGNOSIS — O09899 Supervision of other high risk pregnancies, unspecified trimester: Secondary | ICD-10-CM

## 2017-08-10 DIAGNOSIS — R7989 Other specified abnormal findings of blood chemistry: Secondary | ICD-10-CM | POA: Insufficient documentation

## 2017-08-10 DIAGNOSIS — Z3A30 30 weeks gestation of pregnancy: Secondary | ICD-10-CM | POA: Insufficient documentation

## 2017-08-10 DIAGNOSIS — O288 Other abnormal findings on antenatal screening of mother: Secondary | ICD-10-CM

## 2017-08-10 DIAGNOSIS — O280 Abnormal hematological finding on antenatal screening of mother: Secondary | ICD-10-CM

## 2017-08-10 IMAGING — US US MFM OB FOLLOW-UP
1 series · 13 of 28 positions shown · non-contrast
Comparison: none

[Series 1: us mfm ob follow-up · 51 acquisitions, 13 frames shown]
[im 2/51]
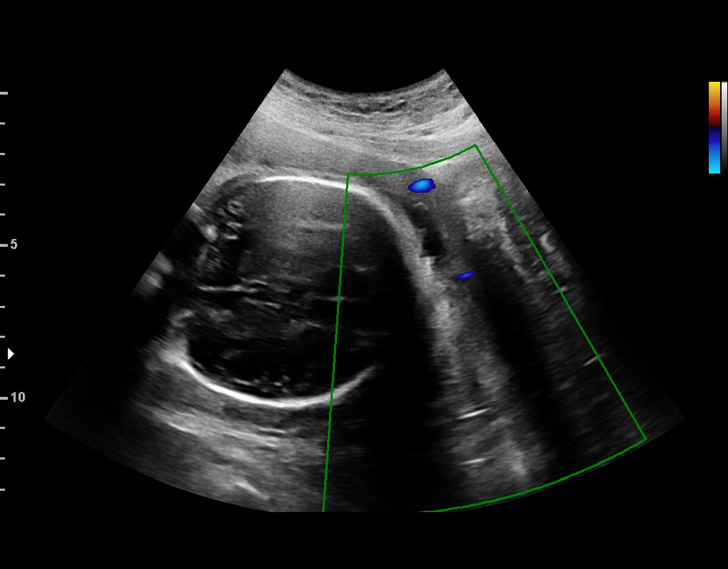
[im 6/51]
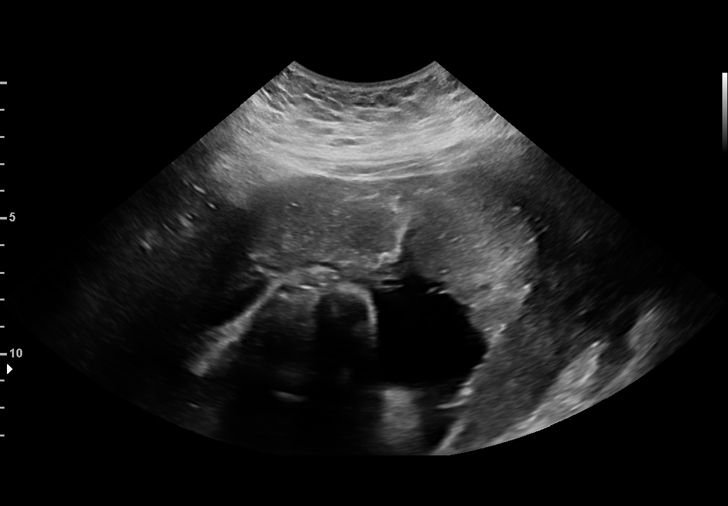
[im 10/51]
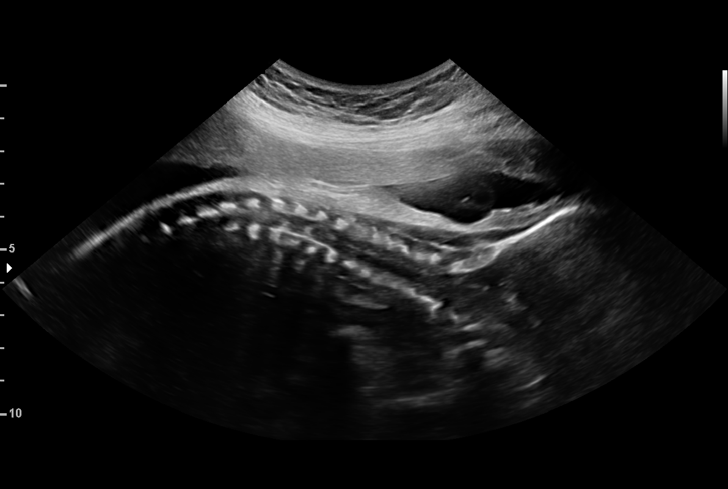
[im 13/51]
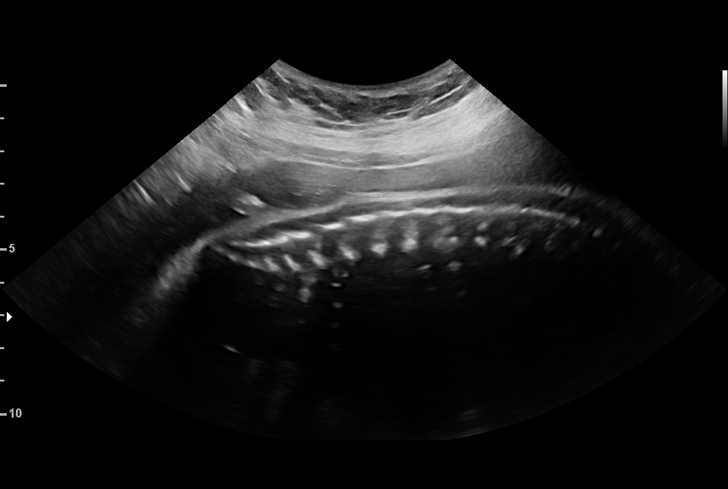
[im 17/51]
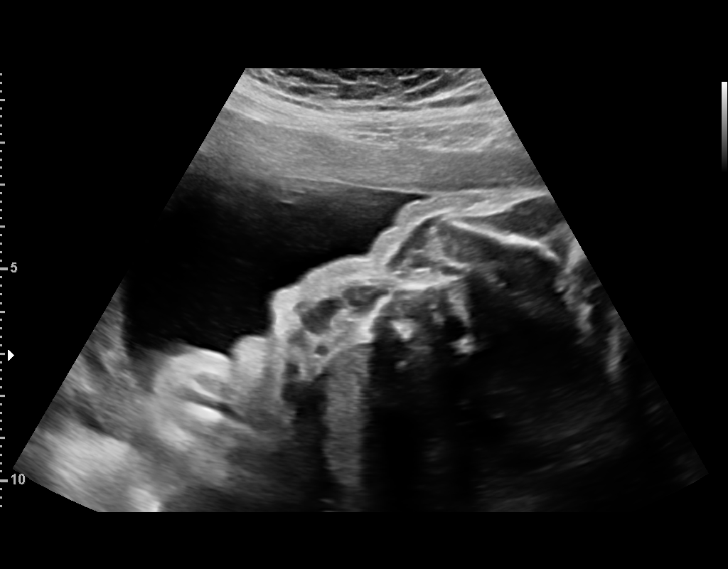
[im 21/51]
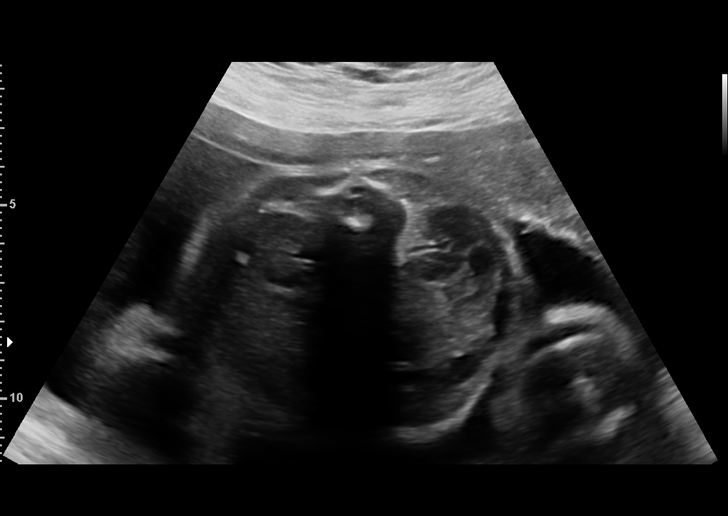
[im 26/51]
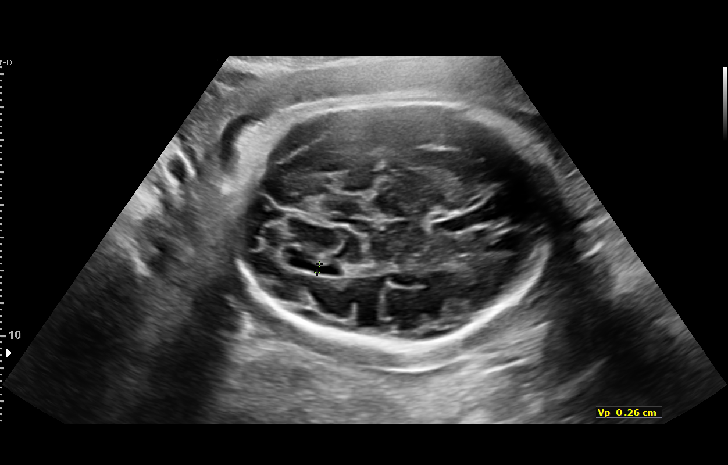
[im 30/51]
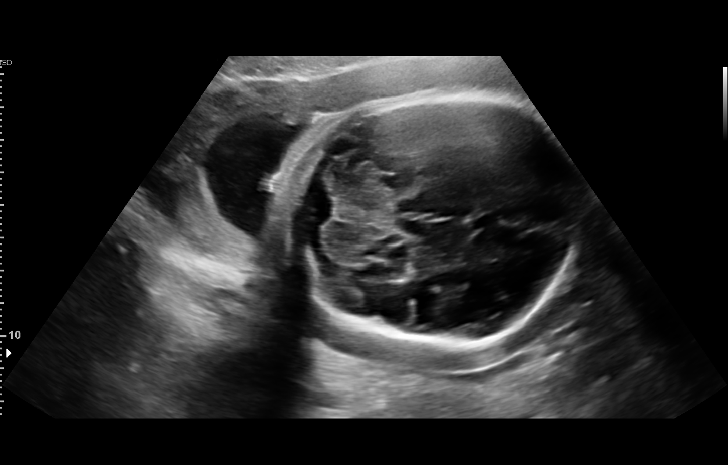
[im 34/51]
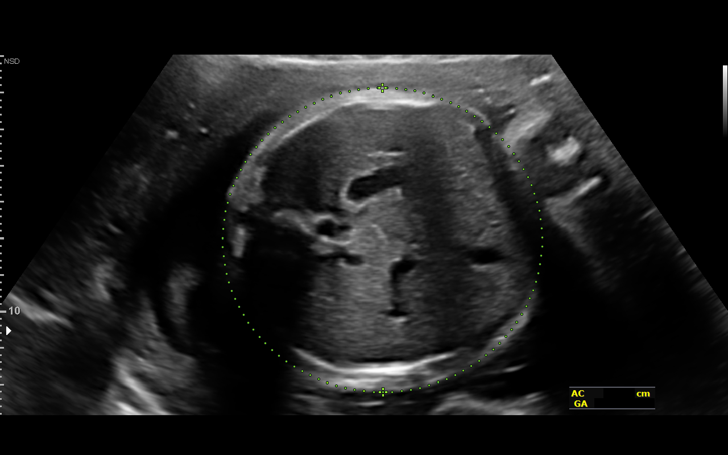
[im 38/51]
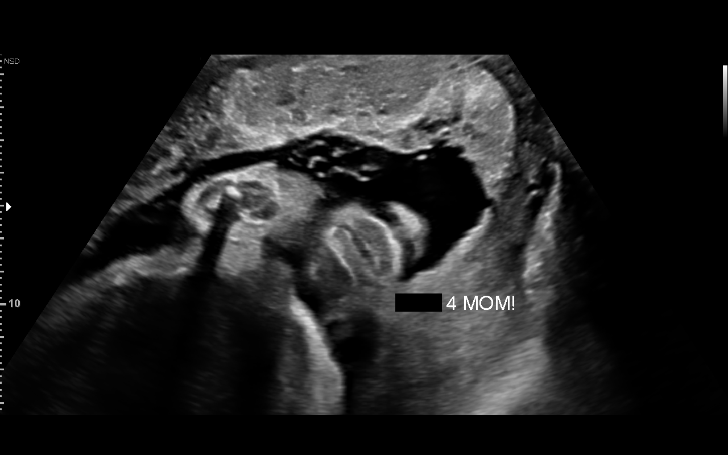
[im 41/51]
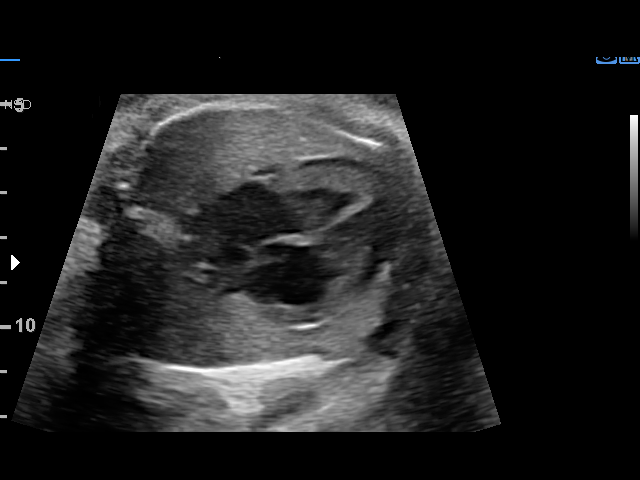
[im 45/51]
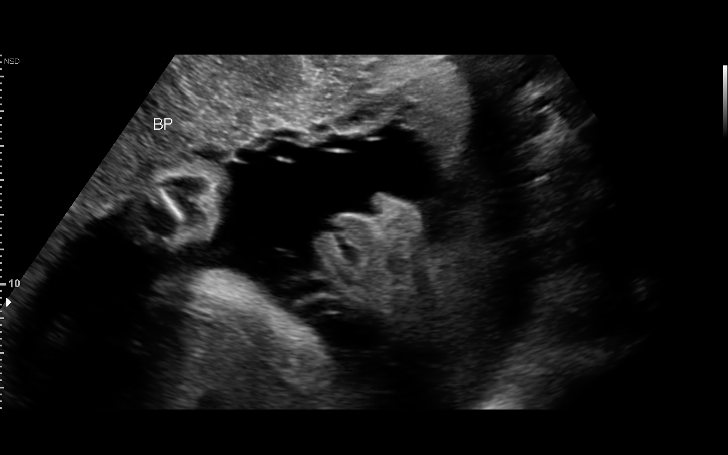
[im 49/51]
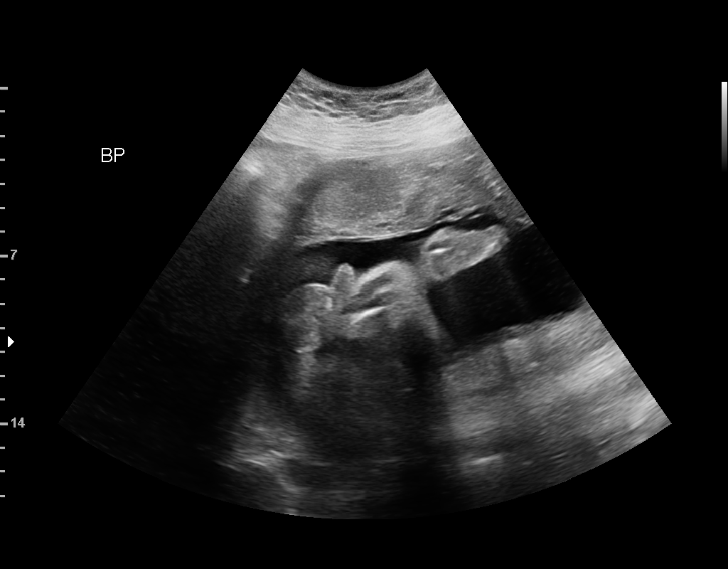

[13 of 28 positions shown; findings below may reference images not displayed]

1  AUAD              [PHONE_NUMBER]      [PHONE_NUMBER]     [PHONE_NUMBER]
Indications

30 weeks gestation of pregnancy
Obesity complicating pregnancy, second         [NX]
trimester (Pre Pregnancy BMI 45.2)
Abnormal biochemical screen (quad) for         [NX]
Trisomy 21 ([DATE]); low risk NIPS
Encounter for other antenatal screening        [NX]
follow-up
High risk pregnancy with high inhibin (MoM     O23.9, [NX]
3.02)
OB History

Blood Type:            Height:  5'6"   Weight (lb):  289       BMI:
Gravidity:    2         Term:   1
Living:       1
Fetal Evaluation

Num Of Fetuses:     1
Fetal Heart         136
Rate(bpm):
Cardiac Activity:   Observed
Presentation:       Cephalic
Placenta:           Anterior, above cervical os
P. Cord Insertion:  Previously Visualized

Amniotic Fluid
AFI FV:      Subjectively within normal limits

AFI Sum(cm)     %Tile       Largest Pocket(cm)
19.5            75

RUQ(cm)       RLQ(cm)       LUQ(cm)        LLQ(cm)
7.28
Biometry
BPD:      73.9  mm     G. Age:  29w 5d         12  %    CI:        68.74   %    70 - 86
FL/HC:      21.3   %    19.3 -
HC:      284.8  mm     G. Age:  31w 2d         29  %    HC/AC:      1.05        0.96 -
AC:       272   mm     G. Age:  31w 2d         63  %    FL/BPD:     82.1   %    71 - 87
FL:       60.7  mm     G. Age:  31w 4d         59  %    FL/AC:      22.3   %    20 - 24

Est. FW:    [NX]  gm    3 lb 13 oz      64  %
Gestational Age

U/S Today:     31w 0d                                        EDD:   [DATE]
Best:          30w 5d     Det. By:  Early Ultrasound         EDD:   [DATE]
([DATE])
Anatomy

Cranium:               Appears normal         Aortic Arch:            Previously seen
Cavum:                 Previously seen        Ductal Arch:            Previously seen
Ventricles:            Appears normal         Diaphragm:              Appears normal
Choroid Plexus:        Previously seen        Stomach:                Appears normal, left
sided
Cerebellum:            Previously seen        Abdomen:                Appears normal
Posterior Fossa:       Previously seen        Abdominal Wall:         Previously seen
Nuchal Fold:           Previously seen        Cord Vessels:           Previously seen
Face:                  Orbits and profile     Kidneys:                Appear normal
previously seen
Lips:                  Appears normal         Bladder:                Appears normal
Thoracic:              Appears normal         Spine:                  Appears normal
Heart:                 Appears normal         Upper Extremities:      Previously seen
(4CH, axis, and situs
RVOT:                  Previously seen        Lower Extremities:      Previously seen
LVOT:                  Previously seen

Other:  Male gender previously seen. Heels previously visualized.
Cervix Uterus Adnexa

Cervix
Not visualized (advanced GA >[NX])

Left Ovary
Previously seen.

Right Ovary
Previously seen
Impression

Singleton intrauterine pregnancy at 30 weeks 5 days
gestation with fetal cardiac activity
Cephalic presentation
Normal interval anatomy; anatomic survey complete
Normal amniotic fluid volume
Appropriate interval growth with EFW at the 64th %tile
Recommendations

Follow-up ultrasound for growth in 6 weeks

## 2017-09-21 ENCOUNTER — Encounter (HOSPITAL_COMMUNITY): Payer: Self-pay

## 2017-09-21 ENCOUNTER — Ambulatory Visit (HOSPITAL_COMMUNITY)
Admission: RE | Admit: 2017-09-21 | Discharge: 2017-09-21 | Disposition: A | Discharge status: Home / Self Care | Payer: Medicaid Other | Source: Ambulatory Visit | Attending: Obstetrics and Gynecology | Admitting: Obstetrics and Gynecology

## 2017-09-26 ENCOUNTER — Other Ambulatory Visit (HOSPITAL_COMMUNITY): Payer: Self-pay | Admitting: Maternal and Fetal Medicine

## 2017-09-26 ENCOUNTER — Ambulatory Visit (HOSPITAL_COMMUNITY)
Admission: RE | Admit: 2017-09-26 | Discharge: 2017-09-26 | Disposition: A | Discharge status: Home / Self Care | Payer: Medicaid Other | Source: Ambulatory Visit | Attending: Obstetrics and Gynecology | Admitting: Obstetrics and Gynecology

## 2017-09-26 ENCOUNTER — Encounter (HOSPITAL_COMMUNITY): Payer: Self-pay

## 2017-09-26 ENCOUNTER — Other Ambulatory Visit (HOSPITAL_COMMUNITY): Payer: Self-pay | Admitting: *Deleted

## 2017-09-26 DIAGNOSIS — O24415 Gestational diabetes mellitus in pregnancy, controlled by oral hypoglycemic drugs: Secondary | ICD-10-CM

## 2017-09-26 DIAGNOSIS — O288 Other abnormal findings on antenatal screening of mother: Secondary | ICD-10-CM | POA: Insufficient documentation

## 2017-09-26 DIAGNOSIS — O289 Unspecified abnormal findings on antenatal screening of mother: Secondary | ICD-10-CM

## 2017-09-26 DIAGNOSIS — Z362 Encounter for other antenatal screening follow-up: Secondary | ICD-10-CM | POA: Diagnosis present

## 2017-09-26 DIAGNOSIS — O99213 Obesity complicating pregnancy, third trimester: Secondary | ICD-10-CM | POA: Insufficient documentation

## 2017-09-26 DIAGNOSIS — Z3A37 37 weeks gestation of pregnancy: Secondary | ICD-10-CM | POA: Diagnosis not present

## 2017-09-26 DIAGNOSIS — E669 Obesity, unspecified: Secondary | ICD-10-CM | POA: Diagnosis not present

## 2017-09-26 DIAGNOSIS — O09893 Supervision of other high risk pregnancies, third trimester: Secondary | ICD-10-CM | POA: Insufficient documentation

## 2017-09-26 DIAGNOSIS — O09899 Supervision of other high risk pregnancies, unspecified trimester: Secondary | ICD-10-CM

## 2017-09-26 HISTORY — DX: Gestational diabetes mellitus in pregnancy, unspecified control: O24.419

## 2017-09-26 NOTE — Addendum Note (Signed)
Encounter addended by: Vivien RotaSmall, Oleg Oleson H, RT on: 09/26/2017 8:52 AM  Actions taken: Imaging Exam ended

## 2017-10-04 ENCOUNTER — Telehealth (HOSPITAL_COMMUNITY): Payer: Self-pay | Admitting: *Deleted

## 2017-10-10 ENCOUNTER — Ambulatory Visit (HOSPITAL_COMMUNITY)
Admission: RE | Admit: 2017-10-10 | Discharge: 2017-10-10 | Disposition: A | Discharge status: Home / Self Care | Payer: Medicaid Other | Source: Ambulatory Visit | Attending: Obstetrics and Gynecology | Admitting: Obstetrics and Gynecology

## 2017-10-10 ENCOUNTER — Encounter (HOSPITAL_COMMUNITY): Payer: Self-pay

## 2018-01-06 ENCOUNTER — Encounter (HOSPITAL_COMMUNITY): Payer: Self-pay

## 2018-01-06 ENCOUNTER — Emergency Department (HOSPITAL_COMMUNITY)
Admission: EM | Admit: 2018-01-06 | Discharge: 2018-01-06 | Disposition: A | Discharge status: Home / Self Care | Payer: Medicaid Other | Attending: Emergency Medicine | Admitting: Emergency Medicine

## 2018-01-06 DIAGNOSIS — Z87891 Personal history of nicotine dependence: Secondary | ICD-10-CM | POA: Diagnosis not present

## 2018-01-06 DIAGNOSIS — Z113 Encounter for screening for infections with a predominantly sexual mode of transmission: Secondary | ICD-10-CM | POA: Insufficient documentation

## 2018-01-06 DIAGNOSIS — Z202 Contact with and (suspected) exposure to infections with a predominantly sexual mode of transmission: Secondary | ICD-10-CM

## 2018-01-06 LAB — URINALYSIS, ROUTINE W REFLEX MICROSCOPIC
Bilirubin Urine: NEGATIVE
Glucose, UA: NEGATIVE mg/dL
KETONES UR: NEGATIVE mg/dL
Leukocytes, UA: NEGATIVE
Nitrite: NEGATIVE
PROTEIN: NEGATIVE mg/dL
Specific Gravity, Urine: 1.004 — ABNORMAL LOW (ref 1.005–1.030)
pH: 6 (ref 5.0–8.0)

## 2018-01-06 LAB — PREGNANCY, URINE: Preg Test, Ur: NEGATIVE

## 2018-01-06 LAB — RAPID HIV SCREEN (HIV 1/2 AB+AG)
HIV 1/2 Antibodies: NONREACTIVE
HIV-1 P24 ANTIGEN - HIV24: NONREACTIVE

## 2018-01-06 MED ORDER — METRONIDAZOLE 500 MG PO TABS
2000.0000 mg | ORAL_TABLET | Freq: Once | ORAL | Status: AC
  Administered 2018-01-06: 2000 mg via ORAL
  Filled 2018-01-06: qty 4

## 2018-01-06 NOTE — ED Triage Notes (Signed)
Patient here from home with reports of trichomonas exposure. Would like treatment.

## 2018-01-06 NOTE — ED Provider Notes (Signed)
[facility]-EMERGENCY DEPT Provider Note   CSN: [id number] Arrival date & time: 01/06/18  0730     History   Chief Complaint Chief Complaint  Patient presents with  . Exposure to STD    HPI Robin [name] is a 22 y.o. female with no pertinent past medical history presenting with a chief complaint of STD check.  The patient reports that she has sexually active with multiple female and female partners over the last few months, but is only currently sexually active with one female partner.  She reports that a female friend disclosed her earlier this week that she tested positive for trichomonas.  The patient is concerned that she has been sexually active with the same partners as her friend.  She intermittently uses condoms.   She denies fever, chills, abdominal pain, pelvic pain, vaginal bleeding, discharge, pain, or itching, dysuria, hematuria, or back pain.  States that she is aware that the health department does STD screening, but felt that it was too complicated to make an appointment.  The history is provided by the patient. No language interpreter was used.    Past Medical History:  Diagnosis Date  . Gestational diabetes     There are no active problems to display for this patient.   Past Surgical History:  Procedure Laterality Date  . ADENOIDECTOMY    . TONSILLECTOMY       OB History    Gravida  2   Para  1   Term  1   Preterm      AB      Living  1     SAB      TAB      Ectopic      Multiple      Live Births               Home Medications    Prior to Admission medications   Not on File    Family History No family history on file.  Social History Social History   Tobacco Use  . Smoking status: Former Games developer  . Smokeless tobacco: Never Used  Substance Use Topics  . Alcohol use: No  . Drug use: No     Allergies   Patient has no known allergies.   Review of Systems Review of Systems  Constitutional:  Negative for activity change, chills and fever.  Respiratory: Negative for shortness of breath.   Cardiovascular: Negative for chest pain.  Gastrointestinal: Negative for abdominal pain, constipation, nausea, rectal pain and vomiting.  Genitourinary: Negative for dysuria, flank pain, frequency, hematuria, urgency, vaginal bleeding, vaginal discharge and vaginal pain.  Musculoskeletal: Negative for back pain.  Skin: Negative for rash.  Allergic/Immunologic: Negative for immunocompromised state.  Neurological: Negative for dizziness, syncope, weakness, numbness and headaches.  Psychiatric/Behavioral: Negative for confusion.   Physical Exam Updated Vital Signs BP (!) 152/82 (BP Location: Right Arm)   Pulse 88   Temp 98.7 F (37.1 C) (Oral)   Resp 18   SpO2 100%   Physical Exam  Constitutional: No distress.  HENT:  Head: Normocephalic.  Eyes: Conjunctivae are normal.  Neck: Neck supple.  Cardiovascular: Normal rate, regular rhythm, normal heart sounds and intact distal pulses. Exam reveals no gallop and no friction rub.  No murmur heard. Pulmonary/Chest: Effort normal and breath sounds normal. No stridor. No respiratory distress. She has no wheezes. She has no rales. She exhibits no tenderness.  Abdominal: Soft. Bowel sounds are normal. She exhibits no  distension and no mass. There is no tenderness. There is no rebound and no guarding. No hernia.  Protuberant abdomen.  Abdomen is soft, nontender, nondistended.  Genitourinary:  Genitourinary Comments: Chaperoned exam.  No cervical motion tenderness.  No adnexal tenderness or enlargement bilaterally.  There is a small amount of thick yellowish/white discharge in the vaginal vault.  Exam is otherwise unremarkable.  Neurological: She is alert.  Skin: Skin is warm. Capillary refill takes less than 2 seconds. No rash noted.  Psychiatric: Her behavior is normal.  Nursing note and vitals reviewed.  ED Treatments / Results  Labs (all labs  ordered are listed, but only abnormal results are displayed) Labs Reviewed  URINALYSIS, ROUTINE W REFLEX MICROSCOPIC - Abnormal; Notable for the following components:      Result Value   Color, Urine STRAW (*)    Specific Gravity, Urine 1.004 (*)    Hgb urine dipstick SMALL (*)    Bacteria, UA RARE (*)    All other components within normal limits  WET PREP, GENITAL  RAPID HIV SCREEN (HIV 1/2 AB+AG)  PREGNANCY, URINE  RPR  GC/CHLAMYDIA PROBE AMP ([place]) NOT AT [facility]    EKG None  Radiology No results found.  Procedures Procedures (including critical care time)  Medications Ordered in ED Medications  metroNIDAZOLE (FLAGYL) tablet 2,000 mg (2,000 mg Oral Given 01/06/18 1057)     Initial Impression / Assessment and Plan / ED Course  I have reviewed the triage vital signs and the nursing notes.  Pertinent labs & imaging results that were available during my care of the patient were reviewed by me and considered in my medical decision making (see chart for details).     22 year old female with no pertinent past medical history presenting for STD check.  She has no complaints at this time. Urinalysis is not concerning for infection.  Regnancy test is negative.  GC/chlamydia, RPR, and HIV are pending at this time.  Received a call from the lab that the wet prep that was collected from the pelvic exam is unable to be run as nursing staff did not include a label on the container.  Recollection is required.  Discussed with the patient repeating the pelvic exam or allowing her to have a wet prep collected when she follows up with her OB/GYN later this week.  Patient declines repeat exam.  She will follow up with her primary care provider.  Given questionable exposure to trichomonas and directly, she has been treated with metronidazole in the ED.  Given that she is asymptomatic, will defer treatment of gonorrhea and chlamydia at this time.  Strict return precautions given.  She is  hemodynamically stable and in no acute distress.  She is safe for discharge home with outpatient follow-up and instructions for additional STD screenings at the health department at this time.  Final Clinical Impressions(s) / ED Diagnoses   Final diagnoses:  Screen for STD (sexually transmitted disease)  Possible exposure to STD    ED Discharge Orders    None       Barkley BoardsMcDonald, Obediah Welles A, PA-C 01/06/18 1857    Terrilee FilesButler, Michael C, MD 01/08/18 1047

## 2018-01-06 NOTE — Discharge Instructions (Signed)
Thank you for allowing me to provide your care today in the emergency department.  You should always use a condom when having sex with a female partner or a dental dam with a female partner to prevent spread of sexually transmitted infection, HIV, or syphilis.  Your gonorrhea, chlamydia, syphilis, and HIV testing are pending.  You have already been treated for trichomonas.   If any of these tests are positive, someone from the hospital will call you at the number that you provided registration today.  You can also download the my chart application and use the number on your discharge paperwork to register for an account.  Lab results are typically available in the app 72 hours after they have resulted.  [facility] recommends abstaining from all sexual activities for 7 days after being treated.  If any of your tests are positive, it is important that you let all of your sexual partners know so they can seek treatment.  It is also important to note that if you were treated and have sex with someone that has not been treated that you can be reinfected.  When you follow-up with your OB/GYN this week, you can have them repeat the wet prep, which checks you for yeast infection, bacterial vaginosis, trichomonas.    You should go to the health department and make an appointment if you need STD testing or screening.   Return to the emergency department if you develop significantly worsening symptoms such as fever, chills, severe pain, redness, or swelling to the penis or testicles, or if the discharge from your penis does not improve.

## 2018-01-07 LAB — RPR: RPR Ser Ql: NONREACTIVE

## 2018-01-07 LAB — GC/CHLAMYDIA PROBE AMP (~~LOC~~) NOT AT ARMC
Chlamydia: NEGATIVE
Neisseria Gonorrhea: NEGATIVE

## 2021-07-10 ENCOUNTER — Emergency Department (HOSPITAL_COMMUNITY): Payer: Medicaid Other

## 2021-07-10 ENCOUNTER — Other Ambulatory Visit: Payer: Self-pay

## 2021-07-10 ENCOUNTER — Encounter (HOSPITAL_COMMUNITY): Payer: Self-pay | Admitting: Emergency Medicine

## 2021-07-10 ENCOUNTER — Emergency Department (HOSPITAL_COMMUNITY)
Admission: EM | Admit: 2021-07-10 | Discharge: 2021-07-10 | Disposition: A | Discharge status: Home / Self Care | Payer: Medicaid Other | Attending: Emergency Medicine | Admitting: Emergency Medicine

## 2021-07-10 DIAGNOSIS — Z20822 Contact with and (suspected) exposure to covid-19: Secondary | ICD-10-CM | POA: Insufficient documentation

## 2021-07-10 DIAGNOSIS — R000 Tachycardia, unspecified: Secondary | ICD-10-CM | POA: Insufficient documentation

## 2021-07-10 DIAGNOSIS — N12 Tubulo-interstitial nephritis, not specified as acute or chronic: Secondary | ICD-10-CM | POA: Insufficient documentation

## 2021-07-10 DIAGNOSIS — R509 Fever, unspecified: Secondary | ICD-10-CM | POA: Diagnosis present

## 2021-07-10 LAB — URINALYSIS, ROUTINE W REFLEX MICROSCOPIC
Bilirubin Urine: NEGATIVE
Glucose, UA: NEGATIVE mg/dL
Ketones, ur: 5 mg/dL — AB
Nitrite: POSITIVE — AB
Protein, ur: 30 mg/dL — AB
Specific Gravity, Urine: 1.021 (ref 1.005–1.030)
pH: 7 (ref 5.0–8.0)

## 2021-07-10 LAB — CBC WITH DIFFERENTIAL/PLATELET
Abs Immature Granulocytes: 0.05 10*3/uL (ref 0.00–0.07)
Basophils Absolute: 0 10*3/uL (ref 0.0–0.1)
Basophils Relative: 0 %
Eosinophils Absolute: 0.2 10*3/uL (ref 0.0–0.5)
Eosinophils Relative: 1 %
HCT: 44.5 % (ref 36.0–46.0)
Hemoglobin: 14.4 g/dL (ref 12.0–15.0)
Immature Granulocytes: 0 %
Lymphocytes Relative: 15 %
Lymphs Abs: 1.9 10*3/uL (ref 0.7–4.0)
MCH: 27.3 pg (ref 26.0–34.0)
MCHC: 32.4 g/dL (ref 30.0–36.0)
MCV: 84.3 fL (ref 80.0–100.0)
Monocytes Absolute: 0.9 10*3/uL (ref 0.1–1.0)
Monocytes Relative: 7 %
Neutro Abs: 9.2 10*3/uL — ABNORMAL HIGH (ref 1.7–7.7)
Neutrophils Relative %: 77 %
Platelets: 235 10*3/uL (ref 150–400)
RBC: 5.28 MIL/uL — ABNORMAL HIGH (ref 3.87–5.11)
RDW: 13.3 % (ref 11.5–15.5)
WBC: 12.2 10*3/uL — ABNORMAL HIGH (ref 4.0–10.5)
nRBC: 0 % (ref 0.0–0.2)

## 2021-07-10 LAB — COMPREHENSIVE METABOLIC PANEL
ALT: 28 U/L (ref 0–44)
AST: 30 U/L (ref 15–41)
Albumin: 4.1 g/dL (ref 3.5–5.0)
Alkaline Phosphatase: 118 U/L (ref 38–126)
Anion gap: 9 (ref 5–15)
BUN: 24 mg/dL — ABNORMAL HIGH (ref 6–20)
CO2: 22 mmol/L (ref 22–32)
Calcium: 9.2 mg/dL (ref 8.9–10.3)
Chloride: 110 mmol/L (ref 98–111)
Creatinine, Ser: 1.05 mg/dL — ABNORMAL HIGH (ref 0.44–1.00)
GFR, Estimated: >60 mL/min (ref >60)
Glucose, Bld: 106 mg/dL — ABNORMAL HIGH (ref 70–99)
Potassium: 3.7 mmol/L (ref 3.5–5.1)
Sodium: 141 mmol/L (ref 135–145)
Total Bilirubin: 0.7 mg/dL (ref 0.3–1.2)
Total Protein: 7.3 g/dL (ref 6.5–8.1)

## 2021-07-10 LAB — I-STAT CHEM 8, ED
BUN: 27 mg/dL — ABNORMAL HIGH (ref 6–20)
Calcium, Ion: 1.1 mmol/L — ABNORMAL LOW (ref 1.15–1.40)
Chloride: 109 mmol/L (ref 98–111)
Creatinine, Ser: 0.9 mg/dL (ref 0.44–1.00)
Glucose, Bld: 103 mg/dL — ABNORMAL HIGH (ref 70–99)
HCT: 46 % (ref 36.0–46.0)
Hemoglobin: 15.6 g/dL — ABNORMAL HIGH (ref 12.0–15.0)
Potassium: 3.7 mmol/L (ref 3.5–5.1)
Sodium: 142 mmol/L (ref 135–145)
TCO2: 21 mmol/L — ABNORMAL LOW (ref 22–32)

## 2021-07-10 LAB — RESP PANEL BY RT-PCR (FLU A&B, COVID) ARPGX2
Influenza A by PCR: NEGATIVE
Influenza B by PCR: NEGATIVE
SARS Coronavirus 2 by RT PCR: NEGATIVE

## 2021-07-10 LAB — LACTIC ACID, PLASMA: Lactic Acid, Venous: 1.3 mmol/L (ref 0.5–1.9)

## 2021-07-10 MED ORDER — LACTATED RINGERS IV BOLUS
1000.0000 mL | Freq: Once | INTRAVENOUS | Status: AC
  Administered 2021-07-10: 1000 mL via INTRAVENOUS

## 2021-07-10 MED ORDER — ACETAMINOPHEN 325 MG PO TABS
650.0000 mg | ORAL_TABLET | Freq: Once | ORAL | Status: AC
  Administered 2021-07-10: 650 mg via ORAL
  Filled 2021-07-10: qty 2

## 2021-07-10 MED ORDER — SODIUM CHLORIDE 0.9 % IV BOLUS: 1000.0000 mL | Freq: Once | INTRAVENOUS | Status: DC

## 2021-07-10 MED ORDER — IBUPROFEN 400 MG PO TABS
600.0000 mg | ORAL_TABLET | Freq: Once | ORAL | Status: AC
  Administered 2021-07-10: 600 mg via ORAL
  Filled 2021-07-10: qty 1

## 2021-07-10 MED ORDER — CEPHALEXIN 500 MG PO CAPS
500.0000 mg | ORAL_CAPSULE | Freq: Four times a day (QID) | ORAL | 0 refills | Status: AC
Start: 2021-07-10 — End: 2021-07-20

## 2021-07-10 NOTE — ED Provider Notes (Signed)
[facility] EMERGENCY DEPARTMENT Provider Note   CSN: [id number] Arrival date & time: 07/10/21  1822     History  Chief Complaint  Patient presents with   Fever   Hematuria    Robin [name] is a 26 y.o. female with no significant past medical history who presents with fever, chills, congestion, and myalgias since this morning.  The patient states that she felt fine yesterday and went out to a bar for New Year's Eve to celebrate.  She also reports an episode of hematuria this morning.  She denies dysuria, flank pain, vaginal bleeding, or vaginal discharge.  Home Medications Prior to Admission medications   Medication Sig Start Date End Date Taking? Authorizing Provider  cephALEXin (KEFLEX) 500 MG capsule Take 1 capsule (500 mg total) by mouth 4 (four) times daily for 10 days. 07/10/21 07/20/21 Yes Nealie Mchatton, Amalia Hailey, MD      Allergies    Patient has no known allergies.    Review of Systems   Review of Systems  Constitutional:  Positive for chills, fatigue and fever.  HENT:  Positive for congestion and rhinorrhea.   Respiratory:  Negative for cough and shortness of breath.   Cardiovascular:  Negative for chest pain.  Gastrointestinal:  Negative for abdominal pain, constipation, diarrhea, nausea and vomiting.  Genitourinary:  Positive for hematuria. Negative for dysuria, flank pain, vaginal bleeding and vaginal discharge.  Musculoskeletal:  Positive for myalgias.  Skin:  Negative for rash.   Physical Exam Updated Vital Signs BP 101/69    Pulse 98    Temp (!) 103 F (39.4 C) (Oral)    Resp 20    SpO2 100%  Physical Exam Vitals and nursing note reviewed.  Constitutional:      General: She is not in acute distress.    Appearance: She is well-developed. She is obese.  HENT:     Head: Normocephalic and atraumatic.     Right Ear: External ear normal.     Left Ear: External ear normal.     Nose: Nose normal.     Mouth/Throat:     Pharynx: Oropharynx is clear.   Eyes:     Extraocular Movements: Extraocular movements intact.     Conjunctiva/sclera: Conjunctivae normal.     Pupils: Pupils are equal, round, and reactive to light.  Cardiovascular:     Rate and Rhythm: Regular rhythm. Tachycardia present.     Pulses: Normal pulses.     Heart sounds: Normal heart sounds. No murmur heard. Pulmonary:     Effort: Pulmonary effort is normal. No respiratory distress.     Breath sounds: Normal breath sounds. No wheezing, rhonchi or rales.  Abdominal:     Palpations: Abdomen is soft.     Tenderness: There is no abdominal tenderness. There is no right CVA tenderness, left CVA tenderness, guarding or rebound.  Musculoskeletal:        General: No swelling.     Cervical back: Neck supple.  Skin:    General: Skin is warm and dry.     Capillary Refill: Capillary refill takes less than 2 seconds.  Neurological:     General: No focal deficit present.     Mental Status: She is alert and oriented to person, place, and time.  Psychiatric:        Mood and Affect: Mood normal.    ED Results / Procedures / Treatments   Labs (all labs ordered are listed, but only abnormal results are displayed) Labs Reviewed  COMPREHENSIVE METABOLIC PANEL - Abnormal; Notable for the following components:      Result Value   Glucose, Bld 106 (*)    BUN 24 (*)    Creatinine, Ser 1.05 (*)    All other components within normal limits  CBC WITH DIFFERENTIAL/PLATELET - Abnormal; Notable for the following components:   WBC 12.2 (*)    RBC 5.28 (*)    Neutro Abs 9.2 (*)    All other components within normal limits  URINALYSIS, ROUTINE W REFLEX MICROSCOPIC - Abnormal; Notable for the following components:   APPearance CLOUDY (*)    Hgb urine dipstick LARGE (*)    Ketones, ur 5 (*)    Protein, ur 30 (*)    Nitrite POSITIVE (*)    Leukocytes,Ua TRACE (*)    Bacteria, UA RARE (*)    All other components within normal limits  I-STAT CHEM 8, ED - Abnormal; Notable for the  following components:   BUN 27 (*)    Glucose, Bld 103 (*)    Calcium, Ion 1.10 (*)    TCO2 21 (*)    Hemoglobin 15.6 (*)    All other components within normal limits  RESP PANEL BY RT-PCR (FLU A&B, COVID) ARPGX2  LACTIC ACID, PLASMA  I-STAT BETA HCG BLOOD, ED ([facility], [facility], [facility] ONLY)    EKG None  Radiology DG Chest 2 View  Result Date: 07/10/2021 CLINICAL DATA:  Fever.  Upper respiratory infection. EXAM: CHEST - 2 VIEW COMPARISON:  One-view scratched at two-view chest x-ray 05/19/2018 FINDINGS: Heart size is exaggerated by low lung volumes. No edema or effusion is present. No focal airspace disease present. IMPRESSION: 1. Low lung volumes. 2. No acute cardiopulmonary disease. Electronically Signed   By: San Morelle M.D.   On: 07/10/2021 20:50    Procedures Procedures   Medications Ordered in ED Medications  acetaminophen (TYLENOL) tablet 650 mg (650 mg Oral Given 07/10/21 1844)  lactated ringers bolus 1,000 mL (0 mLs Intravenous Stopped 07/10/21 2242)  ibuprofen (ADVIL) tablet 600 mg (600 mg Oral Given 07/10/21 2245)    ED Course/ Medical Decision Making/ A&P                           Patient's presentation of fever, chills, myalgias, and hematuria in the setting of infectious appearing urinalysis and leukocytosis is consistent with pyelonephritis.  The patient is overall well-appearing and her tachycardia improved after Tylenol.  Patient was given fluid bolus for mild elevation in creatinine.  Likely prerenal in the setting of poor p.o. intake.  Patient will be discharged with prescription for Keflex to treat pyelonephritis.  Discharge instructions and return precautions were discussed with patient prior to discharge including the AVS.  Patient voiced understanding of these instructions and was amenable to the plan as described.  Patient was then discharged in stable condition.   Final Clinical Impression(s) / ED Diagnoses Final diagnoses:  Pyelonephritis    Rx / DC Orders ED  Discharge Orders          Ordered    cephALEXin (KEFLEX) 500 MG capsule  4 times daily        07/10/21 2218              Varney Baas, MD 07/11/21 [place], DO 07/11/21 1513

## 2021-07-10 NOTE — ED Triage Notes (Signed)
C/o fever, chills, congestion, body aches, and hematuria since this morning.

## 2024-07-09 DIAGNOSIS — O24419 Gestational diabetes mellitus in pregnancy, unspecified control: Secondary | ICD-10-CM | POA: Insufficient documentation

## 2024-07-09 DIAGNOSIS — R002 Palpitations: Secondary | ICD-10-CM | POA: Insufficient documentation

## 2024-07-09 DIAGNOSIS — R079 Chest pain, unspecified: Secondary | ICD-10-CM | POA: Insufficient documentation

## 2024-07-11 ENCOUNTER — Ambulatory Visit

## 2024-07-16 ENCOUNTER — Ambulatory Visit

## 2024-07-24 ENCOUNTER — Ambulatory Visit: Attending: Cardiology | Admitting: Cardiology

## 2024-07-24 VITALS — BP 114/86 | HR 77 | Ht 66.0 in | Wt 318.6 lb

## 2024-07-24 DIAGNOSIS — R002 Palpitations: Secondary | ICD-10-CM | POA: Diagnosis not present

## 2024-07-24 DIAGNOSIS — R079 Chest pain, unspecified: Secondary | ICD-10-CM | POA: Diagnosis not present

## 2024-07-24 DIAGNOSIS — R011 Cardiac murmur, unspecified: Secondary | ICD-10-CM | POA: Insufficient documentation

## 2024-07-24 NOTE — Progress Notes (Signed)
 " Cardiology Office Note:    Date:  07/24/2024   ID:  Robin [name], DOB 11/14/1995, MRN [id number]  PCP:  Patient, No Pcp Per  Cardiologist:  Jennifer JONELLE Crape, MD   Referring MD: Dorene Perkins, NP    ASSESSMENT:    1. Palpitations   2. Murmur, cardiac   3. Cardiac murmur   4. Morbid obesity (HCC)   5. Chest pain, unspecified type    PLAN:    In order of problems listed above:  Primary prevention stressed with the patient.  Importance of compliance with diet medication stressed and patient verbalized standing. Palpitations: I reassured the patient about my findings.  Not a candidate at this point any medications.  There have been benign.  No symptoms of dizziness or syncope or any such issues. Cardiac murmur: Echocardiogram will be done to assess murmur heard on auscultation. Chest pain: Atypical in nature and medical management.  I told the patient to begin regular exercise graded program and she is agreeable. I told her about cardiac she is agreeable. Patient will be seen in follow-up appointment in 6 months or earlier if the patient has any concerns.    Medication Adjustments/Labs and Tests Ordered: Current medicines are reviewed at length with the patient today.  Concerns regarding medicines are outlined above.  Orders Placed This Encounter  Procedures   EKG 12-Lead   ECHOCARDIOGRAM COMPLETE   No orders of the defined types were placed in this encounter.    History of Present Illness:    Robin [name] is a 29 y.o. female who is being seen today for the evaluation of chest pain and palpitations at the request of Dorene Perkins, NP.  Patient is a pleasant 29 year old female.  She has past medical history of morbid obesity.  She gives history of palpitations.  Recent blood work has been fine.  TSH is fine CBC is fine.  She leads a sedentary lifestyle.  She denies any chest pain orthopnea or PND.  She denies any history of hypertension dyslipidemia or diabetes mellitus.  At  the time of my evaluation, the patient is alert awake oriented and in no distress.  No history of syncope.  Her palpitations are cosmetically.  Her event monitor is fine and I reviewed the report and discussed with her.  Also she has chest pain not related to exertion and stabbing in nature.  Very atypical from a coronary standpoint.  Past Medical History:  Diagnosis Date   Chest pain, unspecified    Gestational diabetes    Palpitations     Past Surgical History:  Procedure Laterality Date   ADENOIDECTOMY     TONSILLECTOMY      Current Medications: Active Medications[1]   Allergies:   Patient has no known allergies.   Social History   Socioeconomic History   Marital status: Divorced    Spouse name: Not on file   Number of children: Not on file   Years of education: Not on file   Highest education level: Not on file  Occupational History   Not on file  Tobacco Use   Smoking status: Former   Smokeless tobacco: Never  Substance and Sexual Activity   Alcohol use: No   Drug use: No   Sexual activity: Yes    Birth control/protection: None  Other Topics Concern   Not on file  Social History Narrative   Not on file   Social Drivers of Health   Tobacco Use: Medium Risk (07/24/2024)   Patient  History    Smoking Tobacco Use: Former    Smokeless Tobacco Use: Never    Passive Exposure: Not on Actuary Strain: Not on file  Food Insecurity: Not on file  Transportation Needs: Not on file  Physical Activity: Not on file  Stress: Not on file  Social Connections: Not on file  Depression (EYV7-0): Not on file  Alcohol Screen: Not on file  Housing: Not on file  Utilities: Not on file  Health Literacy: Not on file     Family History: The patient's family history includes Diabetes in her mother; Hypertension in her mother.  ROS:   Please see the history of present illness.    All other systems reviewed and are negative.  EKGs/Labs/Other Studies Reviewed:     The following studies were reviewed today:  EKG Interpretation Date/Time:  Thursday July 24 2024 14:26:25 EST Ventricular Rate:  77 PR Interval:  156 QRS Duration:  86 QT Interval:  378 QTC Calculation: 427 R Axis:   49  Text Interpretation: Normal sinus rhythm Normal ECG No previous ECGs available Confirmed by Edwyna Backers [phone]) on 07/24/2024 3:00:42 PM     Recent Labs: No results found for requested labs within last 365 days.  Recent Lipid Panel No results found for: CHOL, TRIG, HDL, CHOLHDL, VLDL, LDLCALC, LDLDIRECT  Physical Exam:    VS:  BP 114/86   Pulse 77   Ht 5' 6 (1.676 m)   Wt (!) 318 lb 9.6 oz (144.5 kg)   SpO2 96%   BMI 51.42 kg/m     Wt Readings from Last 3 Encounters:  07/24/24 (!) 318 lb 9.6 oz (144.5 kg)  09/26/17 (!) 313 lb 9.6 oz (142.2 kg)  08/10/17 (!) 303 lb (137.4 kg)     GEN: Patient is in no acute distress HEENT: Normal NECK: No JVD; No carotid bruits LYMPHATICS: No lymphadenopathy CARDIAC: S1 S2 regular, 2/6 systolic murmur at the apex. RESPIRATORY:  Clear to auscultation without rales, wheezing or rhonchi  ABDOMEN: Soft, non-tender, non-distended MUSCULOSKELETAL:  No edema; No deformity  SKIN: Warm and dry NEUROLOGIC:  Alert and oriented x 3 PSYCHIATRIC:  Normal affect    Signed, Backers JONELLE Edwyna, MD  07/24/2024 3:13 PM    [facility]      [1]  No outpatient medications have been marked as taking for the 07/24/24 encounter (Office Visit) with Tamryn Popko, Backers JONELLE, MD.   "

## 2024-07-24 NOTE — Patient Instructions (Signed)
 " FDA-cleared personal EKG: The worlds most clinically validated personal EKG, FDA-cleared to detect Atrial Fibrillation, Bradycardia, and Tachycardia. Robin [name] is the most reliable way to check in on your heart from home. Take your EKG from anywhere: Capture a medical-grade EKG in 30 seconds and get an instant analysis right on your smartphone. Robin [name] is small enough to fit in your pocket, so you can take it with you anywhere. Easy to use: Simply place your fingers on the sensors--no wires, patches, or gels. Recommended by doctors: A trusted resource, Robin [name] is the #1 doctor-recommended personal EKG with more than 100 million EKGs recorded. Save or share your EKGs: With the press of a button, email your EKGs to your doctor or save them on your phone. Works with smartphones: Compatible with [employer] and tablets. Check our compatibility chart. FSA/HSA eligible: Purchase using an FSA or HSA account (please confirm coverage with your insurance provider). Phone clip included with purchase, a $15 value. Conveniently take your device with you wherever you go.  [email or web address]   Step One- Record your EKG strip on [facility] app.   Step two- On Kardia EKG click Download   Step three- It will prompt you to make a password for this EKG. Please make the password [name] so that we can view it.   Step four- Click on the little upload button (small box with an arrow in the middle) in the bottom left-hand corner of the screen.   Step five- Click Save to Files  Step six- Click on On my iphone and then Pages then press save in the top right-hand corner.   NOW GO TO MYCHART   Once on MyChart click Messages  Step one- Click Send a message  Step two- Click Ask a medical question   Step three- Click Non urgent medical question   Step four- Click on Robin [name]'s name.  Step five- Click on the small  paperclip at the bottom of the screen  Step six- Click Choose file  Step seven- Pick the most recent EKG strip listed.   Once uploaded send the message!  Medication Instructions:  Your physician recommends that you continue on your current medications as directed. Please refer to the Current Medication list given to you today.  *If you need a refill on your cardiac medications before your next appointment, please call your pharmacy*   Lab Work: None ordered If you have labs (blood work) drawn today and your tests are completely normal, you will receive your results only by: MyChart Message (if you have MyChart) OR A paper copy in the mail If you have any lab test that is abnormal or we need to change your treatment, we will call you to review the results.  Testing/Procedures: Your physician has requested that you have an echocardiogram. Echocardiography is a painless test that uses sound waves to create images of your heart. It provides your doctor with information about the size and shape of your heart and how well your hearts chambers and valves are working. This procedure takes approximately one hour. There are no restrictions for this procedure. Please do NOT wear cologne, perfume, aftershave, or lotions (deodorant is allowed). Please arrive 15 minutes prior to your appointment time.  Please note: We ask at that you not bring children with you during ultrasound (echo/ vascular) testing. Due to room size and safety concerns, children are not allowed in the ultrasound rooms during exams. Our front office staff cannot provide observation of children  in our lobby area while testing is being conducted. An adult accompanying a patient to their appointment will only be allowed in the ultrasound room at the discretion of the ultrasound technician under special circumstances. We apologize for any inconvenience.  Follow-Up: At [facility], you and your health needs are our priority.  As  part of our continuing mission to provide you with exceptional heart care, we have created designated Provider Care Teams.  These Care Teams include your primary Cardiologist (physician) and Advanced Practice Providers (APPs -  Physician Assistants and Nurse Practitioners) who all work together to provide you with the care you need, when you need it.  We recommend signing up for the patient portal called MyChart.  Sign up information is provided on this After Visit Summary.  MyChart is used to connect with patients for Virtual Visits (Telemedicine).  Patients are able to view lab/test results, encounter notes, upcoming appointments, etc.  Non-urgent messages can be sent to your provider as well.   To learn more about what you can do with MyChart, go to [email or web address].    Your next appointment:   9 month(s)  The format for your next appointment:   In Person  Provider:      Other Instructions Echocardiogram An echocardiogram is a test that uses sound waves (ultrasound) to produce images of the heart. Images from an echocardiogram can provide important information about: Heart size and shape. The size and thickness and movement of your heart's walls. Heart muscle function and strength. Heart valve function or if you have stenosis. Stenosis is when the heart valves are too narrow. If blood is flowing backward through the heart valves (regurgitation). A tumor or infectious growth around the heart valves. Areas of heart muscle that are not working well because of poor blood flow or injury from a heart attack. Aneurysm detection. An aneurysm is a weak or damaged part of an artery wall. The wall bulges out from the normal force of blood pumping through the body. Tell a health care provider about: Any allergies you have. All medicines you are taking, including vitamins, herbs, eye drops, creams, and over-the-counter medicines. Any blood disorders you have. Any surgeries you have  had. Any medical conditions you have. Whether you are pregnant or may be pregnant. What are the risks? Generally, this is a safe test. However, problems may occur, including an allergic reaction to dye (contrast) that may be used during the test. What happens before the test? No specific preparation is needed. You may eat and drink normally. What happens during the test? You will take off your clothes from the waist up and put on a hospital gown. Electrodes or electrocardiogram (ECG)patches may be placed on your chest. The electrodes or patches are then connected to a device that monitors your heart rate and rhythm. You will lie down on a table for an ultrasound exam. A gel will be applied to your chest to help sound waves pass through your skin. A handheld device, called a transducer, will be pressed against your chest and moved over your heart. The transducer produces sound waves that travel to your heart and bounce back (or echo back) to the transducer. These sound waves will be captured in real-time and changed into images of your heart that can be viewed on a video monitor. The images will be recorded on a computer and reviewed by your health care provider. You may be asked to change positions or hold your breath for a short time.  This makes it easier to get different views or better views of your heart. In some cases, you may receive contrast through an IV in one of your veins. This can improve the quality of the pictures from your heart. The procedure may vary among health care providers and hospitals.   What can I expect after the test? You may return to your normal, everyday life, including diet, activities, and medicines, unless your health care provider tells you not to do that. Follow these instructions at home: It is up to you to get the results of your test. Ask your health care provider, or the department that is doing the test, when your results will be ready. Keep all follow-up  visits. This is important. Summary An echocardiogram is a test that uses sound waves (ultrasound) to produce images of the heart. Images from an echocardiogram can provide important information about the size and shape of your heart, heart muscle function, heart valve function, and other possible heart problems. You do not need to do anything to prepare before this test. You may eat and drink normally. After the echocardiogram is completed, you may return to your normal, everyday life, unless your health care provider tells you not to do that. This information is not intended to replace advice given to you by your health care provider. Make sure you discuss any questions you have with your health care provider. Document Revised: 02/17/2020 Document Reviewed: 02/17/2020 Elsevier Patient Education  2021 [employer]   Important Information About Sugar        "

## 2024-08-19 ENCOUNTER — Ambulatory Visit
